# Patient Record
Sex: Male | Born: 1960 | Race: White | Hispanic: No | Marital: Married | Smoking: Former smoker
Health system: Southern US, Community
[De-identification: ages and names within clinical notes are randomized; demographics above are authoritative.]

## PROBLEM LIST (undated history)

## (undated) DIAGNOSIS — K5900 Constipation, unspecified: Secondary | ICD-10-CM

## (undated) DIAGNOSIS — I10 Essential (primary) hypertension: Secondary | ICD-10-CM

## (undated) DIAGNOSIS — K5730 Diverticulosis of large intestine without perforation or abscess without bleeding: Secondary | ICD-10-CM

## (undated) DIAGNOSIS — K5792 Diverticulitis of intestine, part unspecified, without perforation or abscess without bleeding: Secondary | ICD-10-CM

## (undated) DIAGNOSIS — M1990 Unspecified osteoarthritis, unspecified site: Secondary | ICD-10-CM

## (undated) DIAGNOSIS — Z8739 Personal history of other diseases of the musculoskeletal system and connective tissue: Secondary | ICD-10-CM

## (undated) HISTORY — PX: DENTAL SURGERY: SHX609

## (undated) HISTORY — PX: COLONOSCOPY: SHX174

---

## 2012-03-04 ENCOUNTER — Other Ambulatory Visit: Payer: Self-pay | Admitting: Neurology

## 2012-03-04 DIAGNOSIS — I739 Peripheral vascular disease, unspecified: Secondary | ICD-10-CM

## 2012-03-08 ENCOUNTER — Ambulatory Visit
Admission: RE | Admit: 2012-03-08 | Discharge: 2012-03-08 | Disposition: A | Discharge status: Home / Self Care | Payer: BC Managed Care – PPO | Source: Ambulatory Visit | Attending: Neurology | Admitting: Neurology

## 2012-03-08 DIAGNOSIS — I739 Peripheral vascular disease, unspecified: Secondary | ICD-10-CM

## 2013-10-31 IMAGING — US US CAROTID DUPLEX BILAT
1 series · 13 of 24 positions shown · non-contrast
Comparison: None.

CLINICAL DATA: Small vessel ischemic changes on brain MRI.

BILATERAL CAROTID DUPLEX ULTRASOUND
TECHNIQUE: Gray scale imaging, color Doppler and duplex ultrasound
was performed of bilateral carotid and vertebral arteries in the
neck.

[Series 1: us carotid duplex bilat · 0.08mm/px · 13 of 68 slices shown]
[im 1/68]
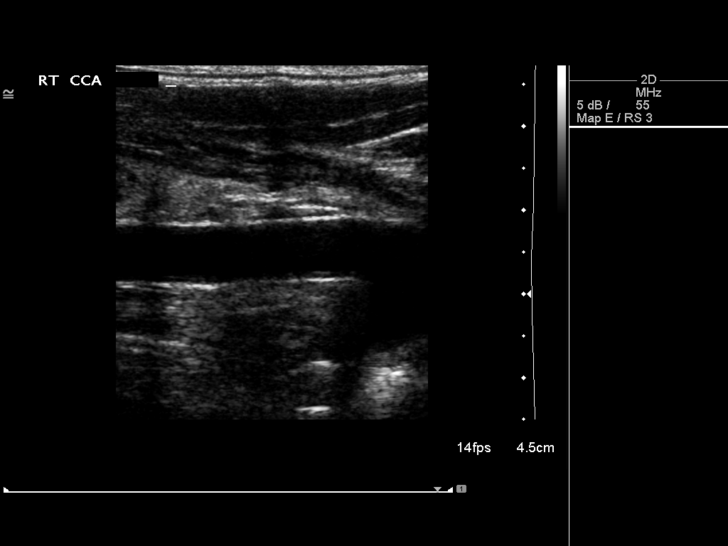
[im 6/68]
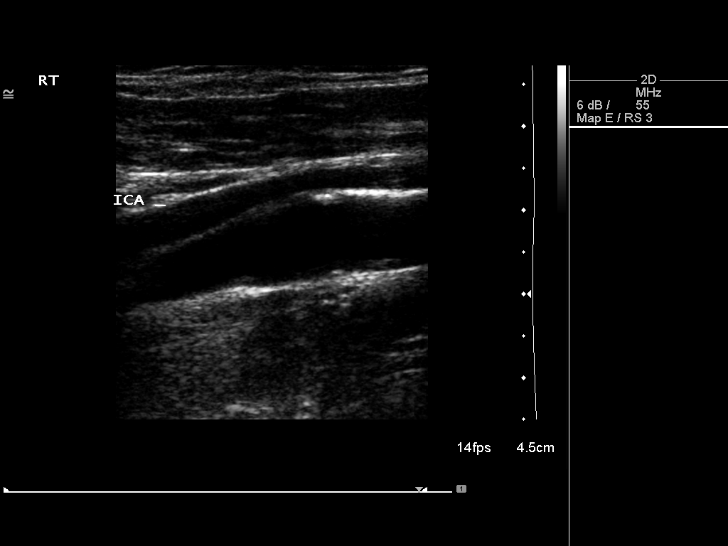
[im 12/68]
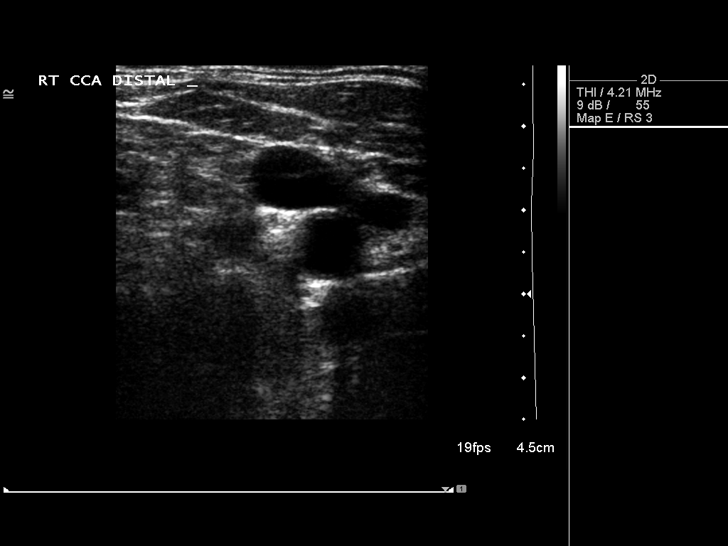
[im 18/68]
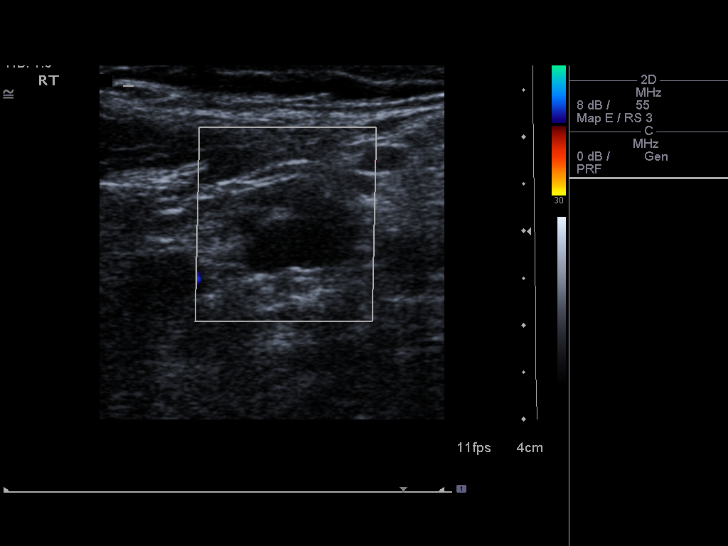
[im 24/68]
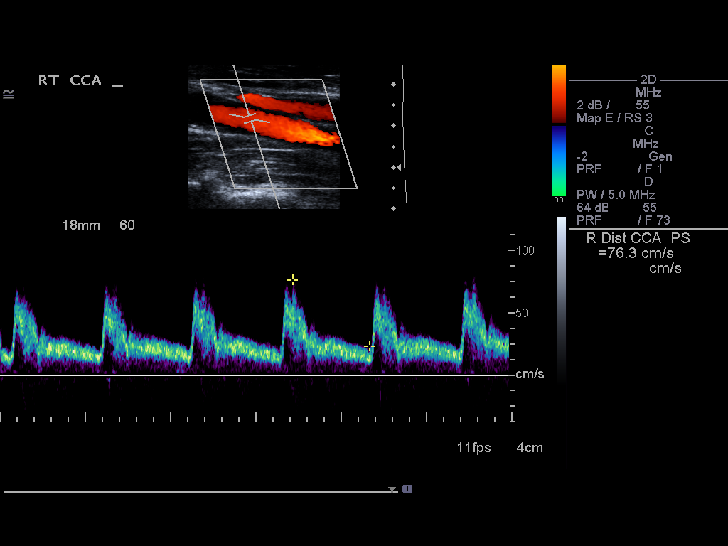
[im 30/68]
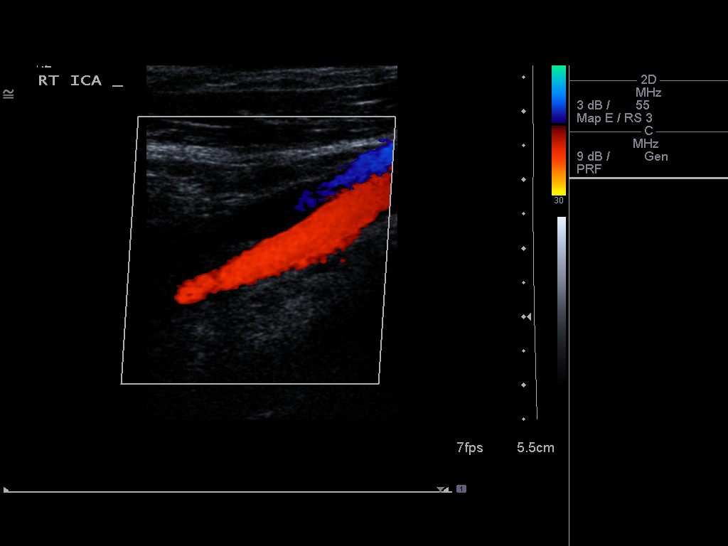
[im 35/68]
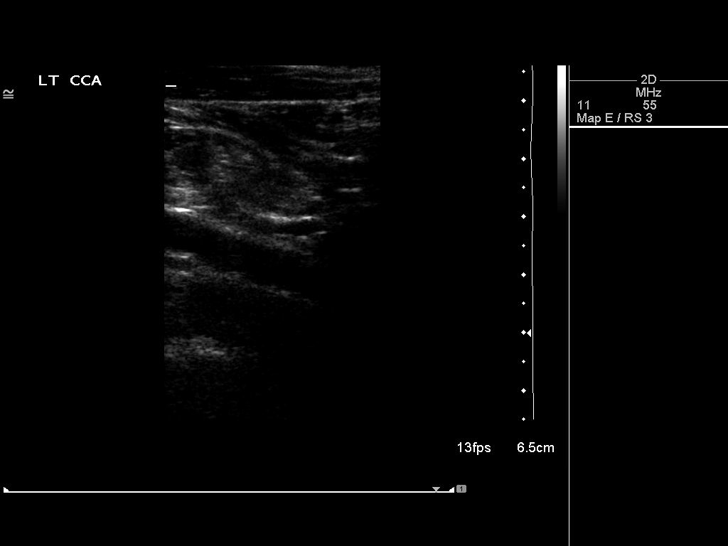
[im 38/68]
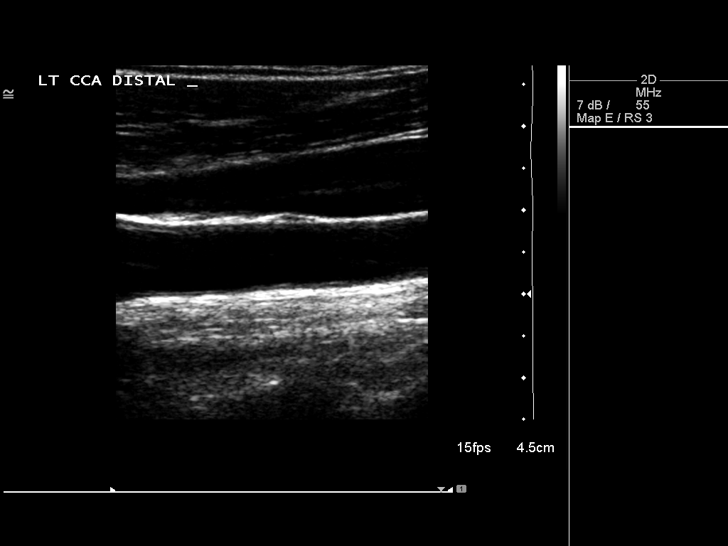
[im 44/68]
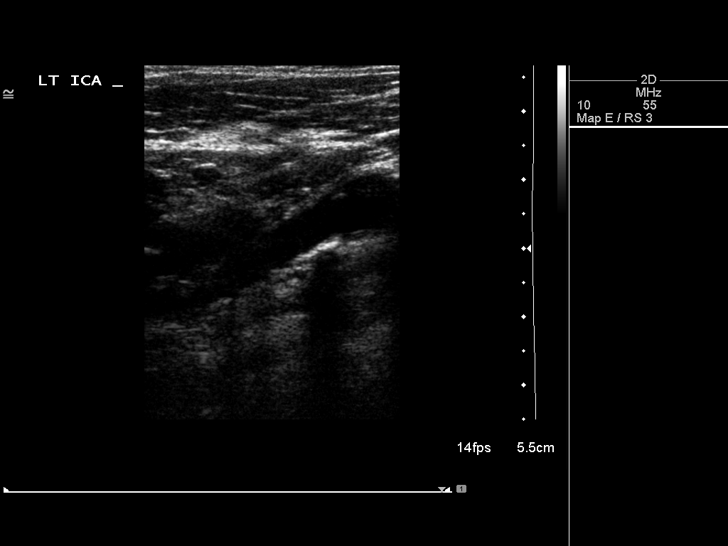
[im 50/68]
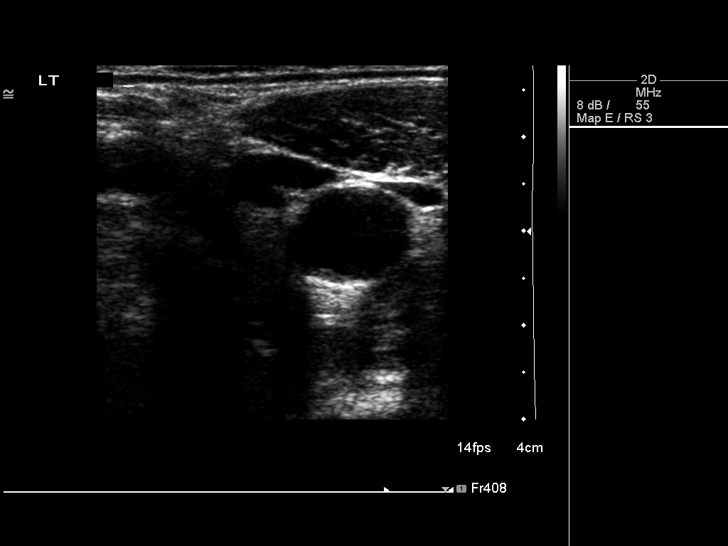
[im 56/68]
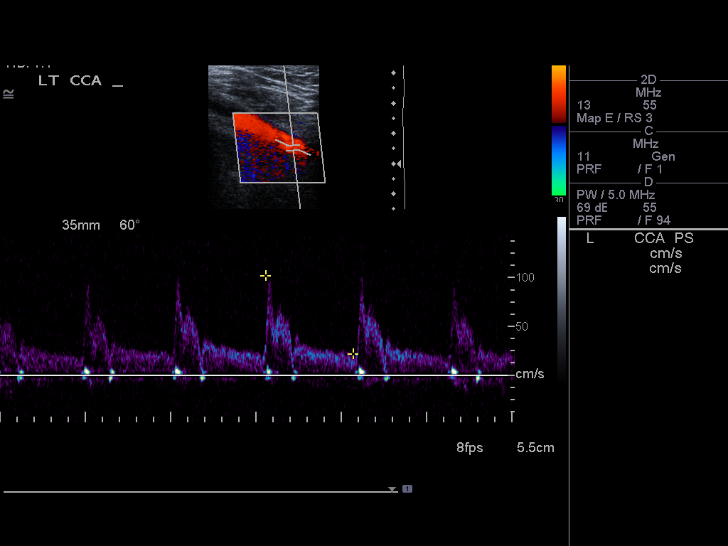
[im 62/68]
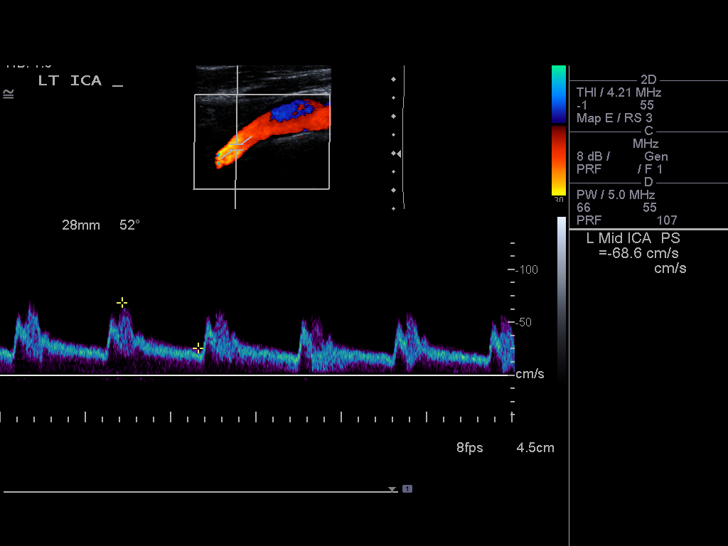
[im 68/68]
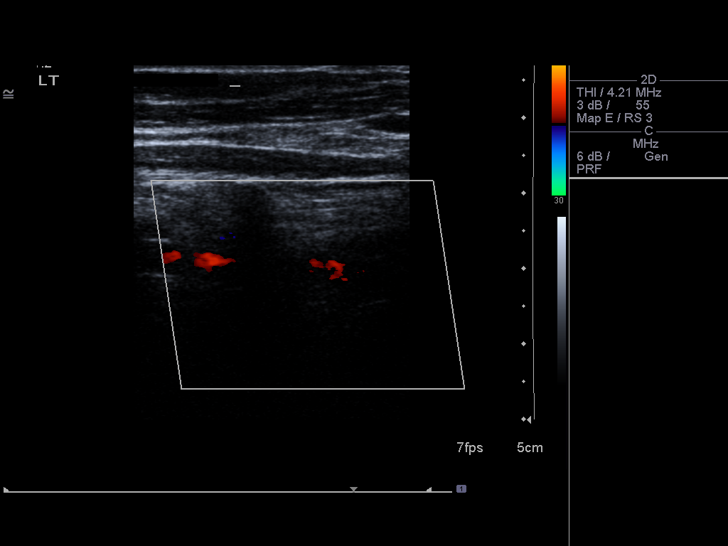

[13 of 24 positions shown; findings below may reference images not displayed]

Criteria:  Quantification of carotid stenosis is based on velocity
parameters that correlate the residual internal carotid diameter
with NASCET-based stenosis levels, using the diameter of the distal
internal carotid lumen as the denominator for stenosis measurement.

The following velocity measurements were obtained:

                 PEAK SYSTOLIC/END DIASTOLIC
RIGHT
ICA:                        74cm/sec
CCA:                        117cm/sec
SYSTOLIC ICA/CCA RATIO:
DIASTOLIC ICA/CCA RATIO:
ECA:                        94cm/sec

LEFT
ICA:                        69cm/sec
CCA:                        105cm/sec
SYSTOLIC ICA/CCA RATIO:
DIASTOLIC ICA/CCA RATIO:
ECA:                        118cm/sec
FINDINGS: RIGHT CAROTID ARTERY: Small amount of atherosclerotic plaque in the
proximal internal carotid artery and carotid bulb. Right
heterogeneous thyroid nodule measuring up to 0.9 cm.  Normal
waveforms and velocities in the right internal carotid artery.

RIGHT VERTEBRAL ARTERY:  Antegrade flow and normal waveform in the
right vertebral artery.]

LEFT CAROTID ARTERY: There is mild plaque in the left internal
carotid artery.  Mild amount of plaque at the left carotid bulb.
Small lymph nodes on both sides of the neck.  Normal waveforms and
velocities in the left internal carotid artery.

LEFT VERTEBRAL ARTERY:  Antegrade flow and normal waveform in the
left vertebral artery.]
IMPRESSION: Mild atherosclerotic disease in the carotid arteries bilaterally.
Estimated degree of stenosis in the internal carotid arteries is
less than 50% bilaterally.

0.9 cm right thyroid nodule.

## 2017-02-10 DIAGNOSIS — Z125 Encounter for screening for malignant neoplasm of prostate: Secondary | ICD-10-CM | POA: Diagnosis not present

## 2017-02-10 DIAGNOSIS — N529 Male erectile dysfunction, unspecified: Secondary | ICD-10-CM | POA: Diagnosis not present

## 2017-02-10 DIAGNOSIS — Z131 Encounter for screening for diabetes mellitus: Secondary | ICD-10-CM | POA: Diagnosis not present

## 2017-02-10 DIAGNOSIS — Z1211 Encounter for screening for malignant neoplasm of colon: Secondary | ICD-10-CM | POA: Diagnosis not present

## 2017-02-10 DIAGNOSIS — Z0000 Encounter for general adult medical examination without abnormal findings: Secondary | ICD-10-CM | POA: Diagnosis not present

## 2017-02-10 DIAGNOSIS — Z13220 Encounter for screening for lipoid disorders: Secondary | ICD-10-CM | POA: Diagnosis not present

## 2017-02-17 LAB — FECAL OCCULT BLOOD, IMMUNOCHEMICAL: IFOBT: NEGATIVE

## 2018-02-23 DIAGNOSIS — I10 Essential (primary) hypertension: Secondary | ICD-10-CM | POA: Diagnosis not present

## 2018-02-23 DIAGNOSIS — E785 Hyperlipidemia, unspecified: Secondary | ICD-10-CM | POA: Diagnosis not present

## 2018-02-23 DIAGNOSIS — N529 Male erectile dysfunction, unspecified: Secondary | ICD-10-CM | POA: Diagnosis not present

## 2018-02-23 DIAGNOSIS — Z0000 Encounter for general adult medical examination without abnormal findings: Secondary | ICD-10-CM | POA: Diagnosis not present

## 2018-02-23 LAB — HEPATIC FUNCTION PANEL
ALT: 20 (ref 10–40)
AST: 16 (ref 14–40)
Alkaline Phosphatase: 76 (ref 25–125)
Bilirubin, Total: 0.7

## 2018-02-23 LAB — PSA: PSA: 0.78

## 2018-02-23 LAB — LIPID PANEL
Cholesterol: 191 (ref 0–200)
HDL: 35 (ref 35–70)
LDL Cholesterol: 130
Triglycerides: 131 (ref 40–160)

## 2018-02-23 LAB — BASIC METABOLIC PANEL
BUN: 18 (ref 4–21)
Creatinine: 1 (ref 0.6–1.3)
Glucose: 101
Potassium: 4.5 (ref 3.4–5.3)
Sodium: 139 (ref 137–147)

## 2019-01-09 DIAGNOSIS — M109 Gout, unspecified: Secondary | ICD-10-CM | POA: Diagnosis not present

## 2019-02-01 DIAGNOSIS — R198 Other specified symptoms and signs involving the digestive system and abdomen: Secondary | ICD-10-CM | POA: Diagnosis not present

## 2019-02-01 DIAGNOSIS — R1032 Left lower quadrant pain: Secondary | ICD-10-CM | POA: Diagnosis not present

## 2019-02-03 DIAGNOSIS — R198 Other specified symptoms and signs involving the digestive system and abdomen: Secondary | ICD-10-CM | POA: Diagnosis not present

## 2019-02-03 DIAGNOSIS — R1032 Left lower quadrant pain: Secondary | ICD-10-CM | POA: Diagnosis not present

## 2019-02-03 DIAGNOSIS — D126 Benign neoplasm of colon, unspecified: Secondary | ICD-10-CM | POA: Diagnosis not present

## 2019-02-03 DIAGNOSIS — K6389 Other specified diseases of intestine: Secondary | ICD-10-CM | POA: Diagnosis not present

## 2019-02-03 DIAGNOSIS — K635 Polyp of colon: Secondary | ICD-10-CM | POA: Diagnosis not present

## 2019-02-03 DIAGNOSIS — K5730 Diverticulosis of large intestine without perforation or abscess without bleeding: Secondary | ICD-10-CM | POA: Diagnosis not present

## 2019-02-03 DIAGNOSIS — K5289 Other specified noninfective gastroenteritis and colitis: Secondary | ICD-10-CM | POA: Diagnosis not present

## 2019-02-03 LAB — HM COLONOSCOPY

## 2019-02-18 ENCOUNTER — Ambulatory Visit: Payer: Self-pay | Admitting: Surgery

## 2019-02-18 DIAGNOSIS — K5790 Diverticulosis of intestine, part unspecified, without perforation or abscess without bleeding: Secondary | ICD-10-CM | POA: Diagnosis not present

## 2019-02-18 DIAGNOSIS — K56699 Other intestinal obstruction unspecified as to partial versus complete obstruction: Secondary | ICD-10-CM | POA: Diagnosis not present

## 2019-02-21 ENCOUNTER — Other Ambulatory Visit: Payer: Self-pay | Admitting: Surgery

## 2019-02-21 DIAGNOSIS — K56699 Other intestinal obstruction unspecified as to partial versus complete obstruction: Secondary | ICD-10-CM

## 2019-02-21 DIAGNOSIS — K5790 Diverticulosis of intestine, part unspecified, without perforation or abscess without bleeding: Secondary | ICD-10-CM

## 2019-03-07 ENCOUNTER — Ambulatory Visit
Admission: RE | Admit: 2019-03-07 | Discharge: 2019-03-07 | Disposition: A | Discharge status: Home / Self Care | Payer: Self-pay | Source: Ambulatory Visit | Attending: Surgery | Admitting: Surgery

## 2019-03-07 DIAGNOSIS — K56699 Other intestinal obstruction unspecified as to partial versus complete obstruction: Secondary | ICD-10-CM

## 2019-03-07 DIAGNOSIS — K5790 Diverticulosis of intestine, part unspecified, without perforation or abscess without bleeding: Secondary | ICD-10-CM

## 2019-03-16 HISTORY — PX: OTHER SURGICAL HISTORY: SHX169

## 2019-03-30 DIAGNOSIS — M659 Synovitis and tenosynovitis, unspecified: Secondary | ICD-10-CM | POA: Diagnosis not present

## 2019-03-30 DIAGNOSIS — M7752 Other enthesopathy of left foot: Secondary | ICD-10-CM | POA: Diagnosis not present

## 2019-03-30 DIAGNOSIS — M19072 Primary osteoarthritis, left ankle and foot: Secondary | ICD-10-CM | POA: Diagnosis not present

## 2019-03-30 DIAGNOSIS — M67372 Transient synovitis, left ankle and foot: Secondary | ICD-10-CM | POA: Diagnosis not present

## 2019-03-30 DIAGNOSIS — M109 Gout, unspecified: Secondary | ICD-10-CM | POA: Diagnosis not present

## 2019-04-01 DIAGNOSIS — M7751 Other enthesopathy of right foot: Secondary | ICD-10-CM | POA: Diagnosis not present

## 2019-04-01 DIAGNOSIS — M659 Synovitis and tenosynovitis, unspecified: Secondary | ICD-10-CM | POA: Diagnosis not present

## 2019-04-03 ENCOUNTER — Encounter: Payer: Self-pay | Admitting: Surgery

## 2019-04-03 DIAGNOSIS — K56699 Other intestinal obstruction unspecified as to partial versus complete obstruction: Secondary | ICD-10-CM | POA: Diagnosis present

## 2019-04-03 NOTE — H&P (Signed)
General Surgery [facility]  Flora Lippsussell A Lutter DOB: 09/08/1961 Married / Language: English / Race: White Male   History of Present Illness  The patient is a 58 year old male who presents with diverticulitis.  CHIEF COMPLAINT: diverticular disease with stricture  Patient is referred by Dr. Vida RiggerMarc Magod for surgical evaluation and management of diverticular disease with stricture. Patient has had a recent history of progressive constipation. He has had at least 3 episodes where he needed to use magnesium citrate to relieve constipation. Patient presented to his gastroenterologist for evaluation and underwent colonoscopy. This demonstrated a significant stricture in the sigmoid colon with associated diverticular disease. Patient is currently taking MiraLAX. He is avoiding fiber. He is referred at this time for consideration for sigmoid colectomy for management of diverticular disease with stricture. Patient has not had any imaging studies. He has had no prior abdominal surgery. There is no family history of diverticular disease or colon cancer.   Past Surgical History Colon Polyp Removal - Colonoscopy  Vasectomy   Diagnostic Studies History Colonoscopy  within last year  Allergies No Known Drug Allergies  [02/18/2019]: Allergies Reconciled   Medication History Losartan Potassium (25MG  Tablet, Oral) Active. Vitamin D (50 MCG(2000 UT) Tablet, Oral) Active. Medications Reconciled  Social History Alcohol use  Occasional alcohol use. Caffeine use  Carbonated beverages, Coffee. Illicit drug use  Remotely quit drug use. Tobacco use  Current every day smoker.  Family History Hypertension  Father.  Other Problems No pertinent past medical history   Review of Systems  General Not Present- Appetite Loss, Chills, Fatigue, Fever, Night Sweats, Weight Gain and Weight Loss. Skin Not Present- Change in Wart/Mole, Dryness, Hives, Jaundice, New  Lesions, Non-Healing Wounds, Rash and Ulcer. HEENT Present- Wears glasses/contact lenses. Not Present- Earache, Hearing Loss, Hoarseness, Nose Bleed, Oral Ulcers, Ringing in the Ears, Seasonal Allergies, Sinus Pain, Sore Throat, Visual Disturbances and Yellow Eyes. Respiratory Not Present- Bloody sputum, Chronic Cough, Difficulty Breathing, Snoring and Wheezing. Breast Not Present- Breast Mass, Breast Pain, Nipple Discharge and Skin Changes. Cardiovascular Not Present- Chest Pain, Difficulty Breathing Lying Down, Leg Cramps, Palpitations, Rapid Heart Rate, Shortness of Breath and Swelling of Extremities. Gastrointestinal Not Present- Abdominal Pain, Bloating, Bloody Stool, Change in Bowel Habits, Chronic diarrhea, Constipation, Difficulty Swallowing, Excessive gas, Gets full quickly at meals, Hemorrhoids, Indigestion, Nausea, Rectal Pain and Vomiting. Male Genitourinary Not Present- Blood in Urine, Change in Urinary Stream, Frequency, Impotence, Nocturia, Painful Urination, Urgency and Urine Leakage. Musculoskeletal Not Present- Back Pain, Joint Pain, Joint Stiffness, Muscle Pain, Muscle Weakness and Swelling of Extremities. Neurological Not Present- Decreased Memory, Fainting, Headaches, Numbness, Seizures, Tingling, Tremor, Trouble walking and Weakness. Psychiatric Not Present- Anxiety, Bipolar, Change in Sleep Pattern, Depression, Fearful and Frequent crying. Endocrine Not Present- Cold Intolerance, Excessive Hunger, Hair Changes, Heat Intolerance, Hot flashes and New Diabetes. Hematology Not Present- Blood Thinners, Easy Bruising, Excessive bleeding, Gland problems, HIV and Persistent Infections.  Vitals Weight: 214 lb Height: 71in Body Surface Area: 2.17 m Body Mass Index: 29.85 kg/m  Temp.: 98.43F  Pulse: 65 (Regular)  BP: 154/84(Sitting, Left Arm, Standard)  Physical Exam   See vital signs recorded above  GENERAL APPEARANCE Development: normal Nutritional status:  normal Gross deformities: none  SKIN Rash, lesions, ulcers: none Induration, erythema: none Nodules: none palpable  EYES Conjunctiva and lids: normal Pupils: equal and reactive Iris: normal bilaterally  EARS, NOSE, MOUTH, THROAT External ears: no lesion or deformity External nose: no lesion or deformity Hearing: grossly  normal Lips: no lesion or deformity Dentition: normal for age Oral mucosa: moist  NECK Symmetric: yes Trachea: midline Thyroid: no palpable nodules in the thyroid bed  CHEST Respiratory effort: normal Retraction or accessory muscle use: no Breath sounds: normal bilaterally Rales, rhonchi, wheeze: none  CARDIOVASCULAR Auscultation: regular rhythm, normal rate Murmurs: none Pulses: carotid and radial pulse 2+ palpable Lower extremity edema: none Lower extremity varicosities: none  ABDOMEN Distension: none Masses: none palpable Tenderness: mild tenderness LLQ, no mass Hepatosplenomegaly: not present Hernia: not present  MUSCULOSKELETAL Station and gait: normal Digits and nails: no clubbing or cyanosis Muscle strength: grossly normal all extremities Range of motion: grossly normal all extremities Deformity: none  LYMPHATIC Cervical: none palpable Supraclavicular: none palpable  PSYCHIATRIC Oriented to person, place, and time: yes Mood and affect: normal for situation Judgment and insight: appropriate for situation    Assessment & Plan  DIVERTICULAR DISEASE (K57.90) 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 Patient is provided with written literature on diverticular disease and colorectal surgery. He will review these at home.  Patient has evidence of a significant diverticular stricture in the sigmoid colon on colonoscopy. I would like to obtain a barium enema to provide a "roadmap" prior to surgery. This  will help [place] determine the location and extent of his resection. We discussed open surgery versus laparoscopic surgery versus robotic surgery. Based on the patient's body habitus and the location of his stricture, I have recommended a low anterior midline open approach to sigmoid colectomy with primary anastomosis. We have discussed the hospitalization to be anticipated. We have discussed his postoperative recovery and return to activity and to work. We have discussed potential complications including bleeding and infection. Patient understands and wishes to proceed with surgery in the near future.  The risks and benefits of the procedure have been discussed at length with the patient. The patient understands the proposed procedure, potential alternative treatments, and the course of recovery to be expected. All of the patient's questions have been answered at this time. The patient wishes to proceed with surgery.  ADDENDUM Barium enema study with findings as expected demonstrated sigmoid diverticular disease and focal stricture.  Plan to proceed with sigmoid colectomy.  Armandina Gemma, [facility] Office: [phone]

## 2019-04-04 ENCOUNTER — Other Ambulatory Visit (HOSPITAL_COMMUNITY)
Admission: RE | Admit: 2019-04-04 | Discharge: 2019-04-04 | Disposition: A | Discharge status: Home / Self Care | Payer: BC Managed Care – PPO | Source: Ambulatory Visit | Attending: Surgery | Admitting: Surgery

## 2019-04-04 ENCOUNTER — Encounter (HOSPITAL_COMMUNITY): Payer: Self-pay

## 2019-04-04 DIAGNOSIS — Z1159 Encounter for screening for other viral diseases: Secondary | ICD-10-CM | POA: Diagnosis not present

## 2019-04-04 NOTE — Patient Instructions (Signed)
DUE TO COVID-19 ONLY ONE VISITOR IS ALLOWED IN THE HOSPITAL AT THIS TIME   COVID SWAB TESTING MUST BE COMPLETED ON: April 04, 2019 (Must self quarantine after testing. Follow instructions on handout.)   Your procedure is scheduled on: Thursday, April 07, 2019   Surgery Time:  12 Noon-2:10PM   [facility]    Report to admitting at 10:00 AM   Call this number if you have problems the morning of surgery [phone]   Miralax:  Mix with 64 oz Gatorade/Powerade. Drink gradually over the next few hours (8 oz glass every 15-30 minutes) until gone the day prior to surgery.    Neomycin: At 2 pm, 3 pm and 10 pm after Miralax bowel prep the day prior to surgery.   Metronidazole:  At 2 pm, 3 pm and 10 pm after Miralax bowel prep the day prior to surgery.   Do not eat food:After Midnight.   May have liquids until 9:00AM day of surgery   CLEAR LIQUID DIET  Foods Allowed                                                                     Foods Excluded  Water, Black Coffee and tea, regular and decaf                             liquids that you cannot  Plain Jell-O in any flavor                                             see through such as: Fruit ices (not with fruit pulp)                                     milk, soups, orange juice  Iced Popsicles                                    All solid food Carbonated beverages, regular and diet                                    Cranberry, grape and apple juices Sports drinks like Gatorade Lightly seasoned clear broth or consume(fat free) Sugar, honey syrup  Sample Menu Breakfast                                Lunch                                     Supper Cranberry juice                    Beef broth  Chicken broth Jell-O                                     Grape juice                           Apple juice Coffee or tea                        Jell-O                                       Popsicle                                                Coffee or tea                        Coffee or tea    Drink 2 Ensure drinks the night before surgery.  Complete one Ensure drink the morning of surgery at 9:00AM prior to scheduled surgery.   Brush your teeth the morning of surgery.   Do NOT smoke after Midnight   Take these medicines the morning of surgery with A SIP OF WATER:   DO NOT TAKE ANY DIABETIC MEDICATIONS DAY OF YOUR SURGERY                               You may not have any metal on your body including hair pins, jewelry, and body piercings             Do not wear make-up, lotions, powders, perfumes/cologne, or deodorant             Do not wear nail polish.  Do not shave  48 hours prior to surgery.              Men may shave face and neck.   Do not bring valuables to the hospital. [place].   Contacts, dentures or bridgework may not be worn into surgery.   Bring small overnight bag day of surgery.    Patients discharged the day of surgery will not be allowed to drive home.   Name and phone number of your driver:   Special Instructions: Bring a copy of your healthcare power of attorney and living will documents         the day of surgery if you haven't scanned them in before.              Please read over the following fact sheets you were given: [facility] - Preparing for Surgery Before surgery, you can play an important role.  Because skin is not sterile, your skin needs to be as free of germs as possible.  You can reduce the number of germs on your skin by washing with CHG (chlorahexidine gluconate) soap before surgery.  CHG is an antiseptic cleaner which kills germs and bonds with the skin to continue killing germs even after washing. Please  DO NOT use if you have an allergy to CHG or antibacterial soaps.  If your skin becomes reddened/irritated stop using the CHG and inform your nurse when you arrive at  Short Stay. Do not shave (including legs and underarms) for at least 48 hours prior to the first CHG shower.  You may shave your face/neck.  Please follow these instructions carefully:  1.  Shower with CHG Soap the night before surgery and the  morning of surgery.  2.  If you choose to wash your hair, wash your hair first as usual with your normal  shampoo.  3.  After you shampoo, rinse your hair and body thoroughly to remove the shampoo.                             4.  Use CHG as you would any other liquid soap.  You can apply chg directly to the skin and wash.  Gently with a scrungie or clean washcloth.  5.  Apply the CHG Soap to your body ONLY FROM THE NECK DOWN.   Do   not use on face/ open                           Wound or open sores. Avoid contact with eyes, ears mouth and   genitals (private parts).                       Wash face,  Genitals (private parts) with your normal soap.             6.  Wash thoroughly, paying special attention to the area where your    surgery  will be performed.  7.  Thoroughly rinse your body with warm water from the neck down.  8.  DO NOT shower/wash with your normal soap after using and rinsing off the CHG Soap.                9.  Pat yourself dry with a clean towel.            10.  Wear clean pajamas.            11.  Place clean sheets on your bed the night of your first shower and do not  sleep with pets. Day of Surgery : Do not apply any lotions/deodorants the morning of surgery.  Please wear clean clothes to the hospital/surgery center.  FAILURE TO FOLLOW THESE INSTRUCTIONS MAY RESULT IN THE CANCELLATION OF YOUR SURGERY  PATIENT SIGNATURE_________________________________  NURSE SIGNATURE__________________________________  ________________________________________________________________________   Rogelia MireIncentive Spirometer  An incentive spirometer is a tool that can help keep your lungs clear and active. This tool measures how well you are filling  your lungs with each breath. Taking long deep breaths may help reverse or decrease the chance of developing breathing (pulmonary) problems (especially infection) following:  A long period of time when you are unable to move or be active. BEFORE THE PROCEDURE   If the spirometer includes an indicator to show your best effort, your nurse or respiratory therapist will set it to a desired goal.  If possible, sit up straight or lean slightly forward. Try not to slouch.  Hold the incentive spirometer in an upright position. INSTRUCTIONS FOR USE  1. Sit on the edge of your bed if possible, or sit up as far as you can in bed or on  a chair. 2. Hold the incentive spirometer in an upright position. 3. Breathe out normally. 4. Place the mouthpiece in your mouth and seal your lips tightly around it. 5. Breathe in slowly and as deeply as possible, raising the piston or the ball toward the top of the column. 6. Hold your breath for 3-5 seconds or for as long as possible. Allow the piston or ball to fall to the bottom of the column. 7. Remove the mouthpiece from your mouth and breathe out normally. 8. Rest for a few seconds and repeat Steps 1 through 7 at least 10 times every 1-2 hours when you are awake. Take your time and take a few normal breaths between deep breaths. 9. The spirometer may include an indicator to show your best effort. Use the indicator as a goal to work toward during each repetition. 10. After each set of 10 deep breaths, practice coughing to be sure your lungs are clear. If you have an incision (the cut made at the time of surgery), support your incision when coughing by placing a pillow or rolled up towels firmly against it. Once you are able to get out of bed, walk around indoors and cough well. You may stop using the incentive spirometer when instructed by your caregiver.  RISKS AND COMPLICATIONS  Take your time so you do not get dizzy or light-headed.  If you are in pain, you may  need to take or ask for pain medication before doing incentive spirometry. It is harder to take a deep breath if you are having pain. AFTER USE  Rest and breathe slowly and easily.  It can be helpful to keep track of a log of your progress. Your caregiver can provide you with a simple table to help with this. If you are using the spirometer at home, follow these instructions: SEEK MEDICAL CARE IF:   You are having difficultly using the spirometer.  You have trouble using the spirometer as often as instructed.  Your pain medication is not giving enough relief while using the spirometer.  You develop fever of 100.5 F (38.1 C) or higher. SEEK IMMEDIATE MEDICAL CARE IF:   You cough up bloody sputum that had not been present before.  You develop fever of 102 F (38.9 C) or greater.  You develop worsening pain at or near the incision site. MAKE SURE YOU:   Understand these instructions.  Will watch your condition.  Will get help right away if you are not doing well or get worse. Document Released: 01/12/2007 Document Revised: 11/24/2011 Document Reviewed: 03/15/2007 [facility] Patient Information 2014 [place], [place].   ________________________________________________________________________

## 2019-04-05 ENCOUNTER — Encounter (HOSPITAL_COMMUNITY)
Admission: RE | Admit: 2019-04-05 | Discharge: 2019-04-05 | Disposition: A | Discharge status: Home / Self Care | Payer: BC Managed Care – PPO | Source: Ambulatory Visit | Attending: Surgery | Admitting: Surgery

## 2019-04-05 ENCOUNTER — Encounter (HOSPITAL_COMMUNITY): Payer: Self-pay

## 2019-04-05 ENCOUNTER — Other Ambulatory Visit: Payer: Self-pay

## 2019-04-05 DIAGNOSIS — Z01818 Encounter for other preprocedural examination: Secondary | ICD-10-CM | POA: Insufficient documentation

## 2019-04-05 HISTORY — DX: Unspecified osteoarthritis, unspecified site: M19.90

## 2019-04-05 HISTORY — DX: Essential (primary) hypertension: I10

## 2019-04-05 HISTORY — DX: Diverticulitis of intestine, part unspecified, without perforation or abscess without bleeding: K57.92

## 2019-04-05 HISTORY — DX: Personal history of other diseases of the musculoskeletal system and connective tissue: Z87.39

## 2019-04-05 HISTORY — DX: Diverticulosis of large intestine without perforation or abscess without bleeding: K57.30

## 2019-04-05 HISTORY — DX: Constipation, unspecified: K59.00

## 2019-04-05 LAB — CBC
HCT: 48.2 % (ref 39.0–52.0)
Hemoglobin: 15.4 g/dL (ref 13.0–17.0)
MCH: 30.3 pg (ref 26.0–34.0)
MCHC: 32 g/dL (ref 30.0–36.0)
MCV: 94.9 fL (ref 80.0–100.0)
Platelets: 285 10*3/uL (ref 150–400)
RBC: 5.08 MIL/uL (ref 4.22–5.81)
RDW: 12.2 % (ref 11.5–15.5)
WBC: 16.1 10*3/uL — ABNORMAL HIGH (ref 4.0–10.5)
nRBC: 0 % (ref 0.0–0.2)

## 2019-04-05 LAB — SARS CORONAVIRUS 2 (TAT 6-24 HRS): SARS Coronavirus 2: NEGATIVE

## 2019-04-05 LAB — BASIC METABOLIC PANEL
Anion gap: 7 (ref 5–15)
BUN: 26 mg/dL — ABNORMAL HIGH (ref 6–20)
CO2: 26 mmol/L (ref 22–32)
Calcium: 9.1 mg/dL (ref 8.9–10.3)
Chloride: 104 mmol/L (ref 98–111)
Creatinine, Ser: 1.02 mg/dL (ref 0.61–1.24)
GFR calc Af Amer: >60 mL/min (ref >60)
GFR calc non Af Amer: >60 mL/min (ref >60)
Glucose, Bld: 92 mg/dL (ref 70–99)
Potassium: 4.8 mmol/L (ref 3.5–5.1)
Sodium: 137 mmol/L (ref 135–145)

## 2019-04-05 LAB — HEMOGLOBIN A1C
Hgb A1c MFr Bld: 6 % — ABNORMAL HIGH (ref 4.8–5.6)
Mean Plasma Glucose: 125.5 mg/dL

## 2019-04-05 MED ORDER — NEOMYCIN SULFATE 500 MG PO TABS
1000.0000 mg | ORAL_TABLET | ORAL | Status: DC
  Filled 2019-04-05: qty 2

## 2019-04-05 MED ORDER — METRONIDAZOLE 500 MG PO TABS
1000.0000 mg | ORAL_TABLET | ORAL | Status: DC
  Filled 2019-04-05: qty 2

## 2019-04-05 MED ORDER — POLYETHYLENE GLYCOL 3350 17 GM/SCOOP PO POWD
1.0000 | Freq: Once | ORAL | Status: DC
  Filled 2019-04-05: qty 255

## 2019-04-05 NOTE — Progress Notes (Signed)
SPOKE W/  Carrel     SCREENING SYMPTOMS OF COVID 19:   COUGH--NO  RUNNY NOSE--- NO  SORE THROAT---NO  NASAL CONGESTION----NO  SNEEZING----NO  SHORTNESS OF BREATH---NO  DIFFICULTY BREATHING---NO  TEMP >100.0 -----NO  UNEXPLAINED BODY ACHES------NO  CHILLS -------- NO  HEADACHES ---------NO  LOSS OF SMELL/ TASTE --------NO    HAVE YOU OR ANY FAMILY MEMBER TRAVELLED PAST 14 DAYS OUT OF THE   COUNTY---NO STATE----NO COUNTRY----NO  HAVE YOU OR ANY FAMILY MEMBER BEEN EXPOSED TO ANYONE WITH COVID 19? NO

## 2019-04-06 ENCOUNTER — Encounter (HOSPITAL_COMMUNITY): Admission: RE | Admit: 2019-04-06 | Payer: Self-pay | Source: Ambulatory Visit

## 2019-04-06 ENCOUNTER — Ambulatory Visit: Payer: Self-pay | Admitting: Physician Assistant

## 2019-04-06 MED ORDER — SODIUM CHLORIDE 0.9 % IV SOLN
INTRAVENOUS | Status: DC
  Filled 2019-04-06: qty 6

## 2019-04-07 ENCOUNTER — Other Ambulatory Visit: Payer: Self-pay

## 2019-04-07 ENCOUNTER — Encounter (HOSPITAL_COMMUNITY)
Admission: RE | Disposition: A | Discharge status: Home / Self Care | Payer: Self-pay | Source: Home / Self Care | Attending: Surgery

## 2019-04-07 ENCOUNTER — Encounter (HOSPITAL_COMMUNITY): Payer: Self-pay | Admitting: Certified Registered Nurse Anesthetist

## 2019-04-07 ENCOUNTER — Inpatient Hospital Stay (HOSPITAL_COMMUNITY): Payer: BC Managed Care – PPO | Admitting: Certified Registered Nurse Anesthetist

## 2019-04-07 ENCOUNTER — Inpatient Hospital Stay (HOSPITAL_COMMUNITY): Payer: BC Managed Care – PPO | Admitting: Physician Assistant

## 2019-04-07 ENCOUNTER — Inpatient Hospital Stay (HOSPITAL_COMMUNITY)
Admission: RE | Admit: 2019-04-07 | Discharge: 2019-04-11 | DRG: 330 | Disposition: A | Discharge status: Home / Self Care | Payer: BC Managed Care – PPO | Attending: Surgery | Admitting: Surgery

## 2019-04-07 DIAGNOSIS — F17200 Nicotine dependence, unspecified, uncomplicated: Secondary | ICD-10-CM | POA: Diagnosis not present

## 2019-04-07 DIAGNOSIS — K5732 Diverticulitis of large intestine without perforation or abscess without bleeding: Secondary | ICD-10-CM | POA: Diagnosis not present

## 2019-04-07 DIAGNOSIS — K56699 Other intestinal obstruction unspecified as to partial versus complete obstruction: Secondary | ICD-10-CM | POA: Diagnosis present

## 2019-04-07 DIAGNOSIS — K5730 Diverticulosis of large intestine without perforation or abscess without bleeding: Secondary | ICD-10-CM | POA: Diagnosis not present

## 2019-04-07 DIAGNOSIS — I10 Essential (primary) hypertension: Secondary | ICD-10-CM | POA: Diagnosis present

## 2019-04-07 DIAGNOSIS — Z8719 Personal history of other diseases of the digestive system: Secondary | ICD-10-CM

## 2019-04-07 DIAGNOSIS — K5792 Diverticulitis of intestine, part unspecified, without perforation or abscess without bleeding: Secondary | ICD-10-CM | POA: Diagnosis not present

## 2019-04-07 DIAGNOSIS — Z8249 Family history of ischemic heart disease and other diseases of the circulatory system: Secondary | ICD-10-CM

## 2019-04-07 DIAGNOSIS — M1990 Unspecified osteoarthritis, unspecified site: Secondary | ICD-10-CM | POA: Diagnosis not present

## 2019-04-07 DIAGNOSIS — N321 Vesicointestinal fistula: Secondary | ICD-10-CM | POA: Diagnosis present

## 2019-04-07 HISTORY — PX: PARTIAL COLECTOMY: SHX5273

## 2019-04-07 LAB — CBC
HCT: 47.5 % (ref 39.0–52.0)
Hemoglobin: 15.2 g/dL (ref 13.0–17.0)
MCH: 30.8 pg (ref 26.0–34.0)
MCHC: 32 g/dL (ref 30.0–36.0)
MCV: 96.2 fL (ref 80.0–100.0)
Platelets: 268 10*3/uL (ref 150–400)
RBC: 4.94 MIL/uL (ref 4.22–5.81)
RDW: 12.3 % (ref 11.5–15.5)
WBC: 27.4 10*3/uL — ABNORMAL HIGH (ref 4.0–10.5)
nRBC: 0 % (ref 0.0–0.2)

## 2019-04-07 LAB — CREATININE, SERUM
Creatinine, Ser: 1.1 mg/dL (ref 0.61–1.24)
GFR calc Af Amer: >60 mL/min (ref >60)
GFR calc non Af Amer: >60 mL/min (ref >60)

## 2019-04-07 SURGERY — COLECTOMY, PARTIAL
Anesthesia: General

## 2019-04-07 MED ORDER — HEPARIN SODIUM (PORCINE) 5000 UNIT/ML IJ SOLN
5000.0000 [IU] | Freq: Once | INTRAMUSCULAR | Status: AC
  Administered 2019-04-07: 11:00:00 5000 [IU] via SUBCUTANEOUS
  Filled 2019-04-07: qty 1

## 2019-04-07 MED ORDER — ROCURONIUM BROMIDE 10 MG/ML (PF) SYRINGE
PREFILLED_SYRINGE | INTRAVENOUS | Status: AC
  Filled 2019-04-07: qty 10

## 2019-04-07 MED ORDER — MIDAZOLAM HCL 2 MG/2ML IJ SOLN
INTRAMUSCULAR | Status: AC
  Filled 2019-04-07: qty 2

## 2019-04-07 MED ORDER — ONDANSETRON HCL 4 MG PO TABS: 4.0000 mg | ORAL_TABLET | Freq: Four times a day (QID) | ORAL | Status: DC | PRN

## 2019-04-07 MED ORDER — ALVIMOPAN 12 MG PO CAPS
12.0000 mg | ORAL_CAPSULE | Freq: Two times a day (BID) | ORAL | Status: DC
  Administered 2019-04-08: 12 mg via ORAL
  Filled 2019-04-07 (×2): qty 1

## 2019-04-07 MED ORDER — KETAMINE HCL 10 MG/ML IJ SOLN
INTRAMUSCULAR | Status: DC | PRN
  Administered 2019-04-07: 40 mg via INTRAVENOUS

## 2019-04-07 MED ORDER — NALOXONE HCL 0.4 MG/ML IJ SOLN: 0.4000 mg | INTRAMUSCULAR | Status: DC | PRN

## 2019-04-07 MED ORDER — PROPOFOL 10 MG/ML IV BOLUS
INTRAVENOUS | Status: DC | PRN
  Administered 2019-04-07: 40 mg via INTRAVENOUS
  Administered 2019-04-07: 160 mg via INTRAVENOUS

## 2019-04-07 MED ORDER — DIPHENHYDRAMINE HCL 12.5 MG/5ML PO ELIX: 12.5000 mg | ORAL_SOLUTION | Freq: Four times a day (QID) | ORAL | Status: DC | PRN

## 2019-04-07 MED ORDER — LIDOCAINE 2% (20 MG/ML) 5 ML SYRINGE
INTRAMUSCULAR | Status: AC
  Filled 2019-04-07: qty 5

## 2019-04-07 MED ORDER — LABETALOL HCL 5 MG/ML IV SOLN
INTRAVENOUS | Status: AC
  Filled 2019-04-07: qty 4

## 2019-04-07 MED ORDER — PROPOFOL 10 MG/ML IV BOLUS
INTRAVENOUS | Status: AC
  Filled 2019-04-07: qty 20

## 2019-04-07 MED ORDER — HYDROMORPHONE HCL 1 MG/ML IJ SOLN
0.2500 mg | INTRAMUSCULAR | Status: DC | PRN
  Administered 2019-04-07 (×4): 0.5 mg via INTRAVENOUS

## 2019-04-07 MED ORDER — KETAMINE HCL 10 MG/ML IJ SOLN
INTRAMUSCULAR | Status: AC
  Filled 2019-04-07: qty 1

## 2019-04-07 MED ORDER — SUGAMMADEX SODIUM 200 MG/2ML IV SOLN
INTRAVENOUS | Status: DC | PRN
  Administered 2019-04-07: 200 mg via INTRAVENOUS

## 2019-04-07 MED ORDER — CELECOXIB 200 MG PO CAPS
200.0000 mg | ORAL_CAPSULE | Freq: Two times a day (BID) | ORAL | Status: DC
  Administered 2019-04-07 – 2019-04-10 (×7): 200 mg via ORAL
  Filled 2019-04-07 (×7): qty 1

## 2019-04-07 MED ORDER — MIDAZOLAM HCL 2 MG/2ML IJ SOLN
INTRAMUSCULAR | Status: DC | PRN
  Administered 2019-04-07: 2 mg via INTRAVENOUS

## 2019-04-07 MED ORDER — MORPHINE SULFATE 2 MG/ML IV SOLN
INTRAVENOUS | Status: DC
  Administered 2019-04-07: 17:00:00 2 mg via INTRAVENOUS
  Administered 2019-04-07: 16.5 mg via INTRAVENOUS
  Administered 2019-04-08: 19.5 mg via INTRAVENOUS
  Administered 2019-04-08: 15 mg via INTRAVENOUS
  Administered 2019-04-08: 9 mg via INTRAVENOUS
  Administered 2019-04-08 (×2): via INTRAVENOUS
  Administered 2019-04-09: 8 mg via INTRAVENOUS
  Administered 2019-04-09: 3 mg via INTRAVENOUS
  Administered 2019-04-09: 0 mg via INTRAVENOUS
  Administered 2019-04-09: 21 mg via INTRAVENOUS
  Filled 2019-04-07 (×2): qty 30
  Filled 2019-04-07: qty 25

## 2019-04-07 MED ORDER — FENTANYL CITRATE (PF) 250 MCG/5ML IJ SOLN
INTRAMUSCULAR | Status: AC
  Filled 2019-04-07: qty 5

## 2019-04-07 MED ORDER — SODIUM CHLORIDE 0.9 % IV SOLN
2.0000 g | INTRAVENOUS | Status: AC
  Administered 2019-04-07: 2 g via INTRAVENOUS
  Filled 2019-04-07: qty 2

## 2019-04-07 MED ORDER — DIPHENHYDRAMINE HCL 50 MG/ML IJ SOLN: 12.5000 mg | Freq: Four times a day (QID) | INTRAMUSCULAR | Status: DC | PRN

## 2019-04-07 MED ORDER — ONDANSETRON HCL 4 MG/2ML IJ SOLN: 4.0000 mg | Freq: Four times a day (QID) | INTRAMUSCULAR | Status: DC | PRN

## 2019-04-07 MED ORDER — FENTANYL CITRATE (PF) 100 MCG/2ML IJ SOLN
INTRAMUSCULAR | Status: AC
  Filled 2019-04-07: qty 2

## 2019-04-07 MED ORDER — LABETALOL HCL 5 MG/ML IV SOLN
5.0000 mg | INTRAVENOUS | Status: AC | PRN
  Administered 2019-04-07 (×2): 5 mg via INTRAVENOUS

## 2019-04-07 MED ORDER — LIDOCAINE HCL (CARDIAC) PF 100 MG/5ML IV SOSY
PREFILLED_SYRINGE | INTRAVENOUS | Status: DC | PRN
  Administered 2019-04-07: 80 mg via INTRAVENOUS

## 2019-04-07 MED ORDER — ENSURE SURGERY PO LIQD
237.0000 mL | Freq: Two times a day (BID) | ORAL | Status: DC
  Administered 2019-04-08 – 2019-04-10 (×6): 237 mL via ORAL
  Filled 2019-04-07 (×8): qty 237

## 2019-04-07 MED ORDER — SODIUM CHLORIDE 0.9% FLUSH: 9.0000 mL | INTRAVENOUS | Status: DC | PRN

## 2019-04-07 MED ORDER — ALVIMOPAN 12 MG PO CAPS
12.0000 mg | ORAL_CAPSULE | ORAL | Status: AC
  Administered 2019-04-07: 12 mg via ORAL
  Filled 2019-04-07: qty 1

## 2019-04-07 MED ORDER — CHLORHEXIDINE GLUCONATE CLOTH 2 % EX PADS: 6.0000 | MEDICATED_PAD | Freq: Once | CUTANEOUS | Status: DC

## 2019-04-07 MED ORDER — HYDROMORPHONE HCL 1 MG/ML IJ SOLN
INTRAMUSCULAR | Status: AC
  Filled 2019-04-07: qty 1

## 2019-04-07 MED ORDER — ENOXAPARIN SODIUM 40 MG/0.4ML ~~LOC~~ SOLN
40.0000 mg | SUBCUTANEOUS | Status: DC
  Administered 2019-04-08 – 2019-04-10 (×3): 40 mg via SUBCUTANEOUS
  Filled 2019-04-07 (×3): qty 0.4

## 2019-04-07 MED ORDER — KCL IN DEXTROSE-NACL 20-5-0.45 MEQ/L-%-% IV SOLN
INTRAVENOUS | Status: DC
  Administered 2019-04-07: 18:00:00 via INTRAVENOUS
  Filled 2019-04-07 (×2): qty 1000

## 2019-04-07 MED ORDER — ONDANSETRON HCL 4 MG/2ML IJ SOLN
INTRAMUSCULAR | Status: DC | PRN
  Administered 2019-04-07: 4 mg via INTRAVENOUS

## 2019-04-07 MED ORDER — ACETAMINOPHEN 325 MG PO TABS: 650.0000 mg | ORAL_TABLET | Freq: Four times a day (QID) | ORAL | Status: DC | PRN

## 2019-04-07 MED ORDER — LACTATED RINGERS IV SOLN
INTRAVENOUS | Status: DC
  Administered 2019-04-07 (×2): via INTRAVENOUS
  Administered 2019-04-07: 17:00:00 1000 mL via INTRAVENOUS
  Administered 2019-04-07: 13:00:00 via INTRAVENOUS

## 2019-04-07 MED ORDER — LIDOCAINE 2% (20 MG/ML) 5 ML SYRINGE
INTRAMUSCULAR | Status: DC | PRN
  Administered 2019-04-07: 1.5 mg/kg/h via INTRAVENOUS
  Administered 2019-04-07: 14:00:00 via INTRAVENOUS

## 2019-04-07 MED ORDER — SUCCINYLCHOLINE CHLORIDE 200 MG/10ML IV SOSY
PREFILLED_SYRINGE | INTRAVENOUS | Status: AC
  Filled 2019-04-07: qty 10

## 2019-04-07 MED ORDER — GABAPENTIN 300 MG PO CAPS
300.0000 mg | ORAL_CAPSULE | Freq: Two times a day (BID) | ORAL | Status: DC
  Administered 2019-04-07 – 2019-04-10 (×7): 300 mg via ORAL
  Filled 2019-04-07 (×7): qty 1

## 2019-04-07 MED ORDER — FENTANYL CITRATE (PF) 250 MCG/5ML IJ SOLN
INTRAMUSCULAR | Status: DC | PRN
  Administered 2019-04-07: 50 ug via INTRAVENOUS
  Administered 2019-04-07: 100 ug via INTRAVENOUS
  Administered 2019-04-07: 50 ug via INTRAVENOUS
  Administered 2019-04-07: 100 ug via INTRAVENOUS
  Administered 2019-04-07 (×2): 50 ug via INTRAVENOUS
  Administered 2019-04-07 (×2): 100 ug via INTRAVENOUS

## 2019-04-07 MED ORDER — DEXAMETHASONE SODIUM PHOSPHATE 10 MG/ML IJ SOLN
INTRAMUSCULAR | Status: DC | PRN
  Administered 2019-04-07: 5 mg via INTRAVENOUS

## 2019-04-07 MED ORDER — ROCURONIUM BROMIDE 10 MG/ML (PF) SYRINGE
PREFILLED_SYRINGE | INTRAVENOUS | Status: DC | PRN
  Administered 2019-04-07: 70 mg via INTRAVENOUS
  Administered 2019-04-07: 20 mg via INTRAVENOUS

## 2019-04-07 SURGICAL SUPPLY — 44 items
BLADE EXTENDED COATED 6.5IN (ELECTRODE) ×3 IMPLANT
CHLORAPREP W/TINT 26 (MISCELLANEOUS) ×3 IMPLANT
COVER SURGICAL LIGHT HANDLE (MISCELLANEOUS) ×6 IMPLANT
COVER WAND RF STERILE (DRAPES) IMPLANT
DRAPE UTILITY XL STRL (DRAPES) ×3 IMPLANT
DRSG OPSITE POSTOP 4X10 (GAUZE/BANDAGES/DRESSINGS) IMPLANT
DRSG OPSITE POSTOP 4X6 (GAUZE/BANDAGES/DRESSINGS) IMPLANT
DRSG OPSITE POSTOP 4X8 (GAUZE/BANDAGES/DRESSINGS) IMPLANT
DRSG PAD ABDOMINAL 8X10 ST (GAUZE/BANDAGES/DRESSINGS) IMPLANT
ELECT PENCIL ROCKER SW 15FT (MISCELLANEOUS) ×3 IMPLANT
ELECT REM PT RETURN 15FT ADLT (MISCELLANEOUS) ×3 IMPLANT
EVACUATOR SILICONE 100CC (DRAIN) ×3 IMPLANT
GAUZE SPONGE 4X4 12PLY STRL (GAUZE/BANDAGES/DRESSINGS) IMPLANT
GLOVE SURG ORTHO 8.0 STRL STRW (GLOVE) ×18 IMPLANT
GOWN STRL REUS W/TWL XL LVL3 (GOWN DISPOSABLE) ×12 IMPLANT
HANDLE SUCTION POOLE (INSTRUMENTS) IMPLANT
KIT TURNOVER KIT A (KITS) ×3 IMPLANT
LIGASURE IMPACT 36 18CM CVD LR (INSTRUMENTS) ×3 IMPLANT
PACK COLON (CUSTOM PROCEDURE TRAY) ×3 IMPLANT
SPONGE LAP 18X18 RF (DISPOSABLE) ×6 IMPLANT
STAPLER CIRC CVD 29MM 37CM (STAPLE) ×3 IMPLANT
STAPLER CUT CVD 40MM BLUE (STAPLE) ×3 IMPLANT
STAPLER PROXIMATE 75MM BLUE (STAPLE) ×3 IMPLANT
STAPLER VISISTAT 35W (STAPLE) IMPLANT
SUCTION POOLE HANDLE (INSTRUMENTS)
SUT ETHILON 2 0 PS N (SUTURE) ×3 IMPLANT
SUT NOV 1 T60/GS (SUTURE) IMPLANT
SUT NOVA NAB DX-16 0-1 5-0 T12 (SUTURE) ×15 IMPLANT
SUT NOVA T20/GS 25 (SUTURE) IMPLANT
SUT PDS AB 1 TP1 96 (SUTURE) IMPLANT
SUT PROLENE 0 SH 30 (SUTURE) ×3 IMPLANT
SUT SILK 2 0 (SUTURE) ×2
SUT SILK 2 0 SH CR/8 (SUTURE) ×3 IMPLANT
SUT SILK 2 0SH CR/8 30 (SUTURE) IMPLANT
SUT SILK 2-0 18XBRD TIE 12 (SUTURE) ×1 IMPLANT
SUT SILK 2-0 30XBRD TIE 12 (SUTURE) IMPLANT
SUT SILK 3 0 (SUTURE)
SUT SILK 3 0 SH CR/8 (SUTURE) ×3 IMPLANT
SUT SILK 3-0 18XBRD TIE 12 (SUTURE) IMPLANT
TOWEL OR 17X26 10 PK STRL BLUE (TOWEL DISPOSABLE) IMPLANT
TOWEL OR NON WOVEN STRL DISP B (DISPOSABLE) IMPLANT
TRAY FOLEY MTR SLVR 16FR STAT (SET/KITS/TRAYS/PACK) ×3 IMPLANT
TUBING CONNECTING 10 (TUBING) ×4 IMPLANT
TUBING CONNECTING 10' (TUBING) ×2

## 2019-04-07 NOTE — Anesthesia Preprocedure Evaluation (Signed)
Anesthesia Evaluation  Patient identified by MRN, date of birth, ID band Patient awake    Reviewed: Allergy & Precautions, NPO status , Patient's Chart, lab work & pertinent test results  Airway Mallampati: II  TM Distance: >3 FB     Dental   Pulmonary former smoker,    breath sounds clear to auscultation       Cardiovascular hypertension,  Rhythm:Regular Rate:Normal     Neuro/Psych    GI/Hepatic Neg liver ROS, History noted. CG   Endo/Other  negative endocrine ROS  Renal/GU      Musculoskeletal   Abdominal   Peds  Hematology   Anesthesia Other Findings   Reproductive/Obstetrics                             Anesthesia Physical Anesthesia Plan  ASA: III  Anesthesia Plan: General   Post-op Pain Management:    Induction: Intravenous  PONV Risk Score and Plan: 2 and Ondansetron, Dexamethasone and Midazolam  Airway Management Planned: Oral ETT  Additional Equipment:   Intra-op Plan:   Post-operative Plan: Possible Post-op intubation/ventilation  Informed Consent:     Dental advisory given  Plan Discussed with:   Anesthesia Plan Comments:         Anesthesia Quick Evaluation

## 2019-04-07 NOTE — Anesthesia Procedure Notes (Signed)
Procedure Name: Intubation Date/Time: 04/07/2019 12:57 PM Performed by: Raenette Rover, CRNA Pre-anesthesia Checklist: Patient identified, Emergency Drugs available, Suction available and Patient being monitored Patient Re-evaluated:Patient Re-evaluated prior to induction Oxygen Delivery Method: Circle system utilized Preoxygenation: Pre-oxygenation with 100% oxygen Induction Type: IV induction Ventilation: Mask ventilation without difficulty Laryngoscope Size: Glidescope and 3 Grade View: Grade I Tube type: Oral Tube size: 7.5 mm Number of attempts: 4 Airway Equipment and Method: Stylet and Video-laryngoscopy Placement Confirmation: ETT inserted through vocal cords under direct vision,  positive ETCO2,  CO2 detector and breath sounds checked- equal and bilateral Secured at: 22 cm Tube secured with: Tape Difficulty Due To: Difficulty was unanticipated Comments: Stiff neck. Difficult to extend neck for intubation. Attempts with MAC 4, MAC 3, and Miller 2 unsuccessful. Glidescope LoPro3 yielded intubation with Grade 1 view.

## 2019-04-07 NOTE — Op Note (Signed)
Operative Note  Pre-operative Diagnosis:  Diverticular disease with sigmoid stricture  Post-operative Diagnosis:  same  Surgeon:  Armandina Gemma, MD  Assistant:  Nedra Hai, MD   Procedure:  Sigmoid colectomy  Anesthesia:  General  Estimated Blood Loss:  200 cc  Drains: 19Fr Blake drain in pelvis         Specimen: sigmoid colon to pathology  Indications:  Patient is referred by Dr. Clarene Essex for surgical evaluation and management of diverticular disease with stricture. Patient has had a recent history of progressive constipation. He has had at least 3 episodes where he needed to use magnesium citrate to relieve constipation. Patient presented to his gastroenterologist for evaluation and underwent colonoscopy. This demonstrated a significant stricture in the sigmoid colon with associated diverticular disease. Patient is currently taking MiraLAX. He is avoiding fiber. He is referred at this time for consideration for sigmoid colectomy for management of diverticular disease with stricture.   Procedure Details:  The patient was seen in the pre-op holding area. The risks, benefits, complications, treatment options, and expected outcomes were previously discussed with the patient. The patient agreed with the proposed plan and has signed the informed consent form.  The patient was brought to the operating room by the surgical team, identified as David Maxwell and the procedure verified. A "time out" was completed and the above information confirmed.  Following administration of general endotracheal anesthesia, the patient is positioned in lithotomy and then prepped and draped in the usual aseptic fashion.  After ascertaining that an adequate level of anesthesia been achieved, a lower midline abdominal incision is made with a #10 blade.  Dissection is carried through subcutaneous tissues.  Fascia is incised in the midline and the peritoneal cavity is entered cautiously.  Abdomen is  explored.  There is significant focal diverticular disease in the mid to distal sigmoid colon.  Colon is relatively firmly affixed to the left posterior wall of the bladder.  Balfour retractors placed for exposure.  Beginning at the distal descending colon the peritoneum was incised at the white line and carried distally along the sigmoid mesentery.  The incision was also extended cephalad along the lateral aspect of the descending colon in order to provide for adequate mobilization.  Palpation in the pelvis reveals a firm dense inflammatory mass in the mid to distal portion of the sigmoid colon which is densely adherent to the left posterior aspect of the bladder.  Distal to this there is soft bowel prior to entering the rectum.  A point in the distal descending colon just above visible diverticuli is selected and transected with a GIA stapler.  Mesentery of the sigmoid colon is then taken down using the LigaSure as well as interrupted 2-0 silk figure-of-eight sutures were needed for hemostasis.  Dissection is carried distally past the inflammatory mass.  Mass is then mobilized off of the bladder and pelvic sidewall using the electrocautery for hemostasis.  With some difficulty the mass is separated from the posterior aspect of the bladder leaving a small amount of colon mucosa on the posterior wall of the bladder.  This is cauterized.  There does not appear to be any obvious fistula into the bladder and there does not appear to be any leakage of urine into the pelvis.  Remainder of the mesentery is divided with the LigaSure down to the proximal rectum.  Using a contour stapler the proximal rectum is transected and the specimen is removed from the abdomen.  A sutures used to mark  the distal margin of the sigmoid resection.  It is submitted to pathology for review.  Next the descending colon is further mobilized in order to allow the distal anterior reach to the rectum without tension.  The staple line is  excised.  A rim of tissue was excised to include a couple of diverticuli which were not immediately evident.  A 0 Prolene pursestring suture was then placed.  Using sizers, a 29 EEA stapler is selected.  Anvil is inserted into the distal end of the descending colon and the pursestring suture is tied securely.  The anus is dilated and the 25 dilator is advanced through the rectum and up to the staple line in the proximal rectum.  The 29 dilator is also passed to the staple line without difficulty.  Stapler was then inserted into the rectum advanced to the staple line.  It is angled so that the trocar emerges through the anterior wall of the rectum just above the staple line.  Trocar is advanced.  Anvil is reassembled on to the trocar and the stapler is closed and fired in the usual fashion and the stapler was removed from the rectum.  Pelvis is filled with saline.  Using a rigid proctoscope the rectum is insufflated with air until the bowel is moderately distended.  There is no evidence of leakage from the newly formed anastomosis.  Air is evacuated and the scope was removed from the rectum.  Pelvis is irrigated copiously with warm saline which is evacuated.  Due to the inflammation on the posterior wall the bladder and the appearance of a developing colovesical fistula, a 2219 [place] Blake drain is left in the pelvis anterior to the anastomosis along the posterior wall of the bladder in order to monitor for possible leakage.  Drain is exited through the left lower quadrant of the abdominal wall and secured to the skin with a 2-0 nylon suture.  Omentum is then used to cover the bowel.  Midline incision is then closed in a single layer with interrupted #1 Novafil sutures.  Subcutaneous tissues irrigated.  Skin is closed with stainless steel staples.  Honeycomb dressing is placed.  Drain is placed to bulb suction.  Patient is awakened from anesthesia and brought to the recovery room.  The patient tolerated the  procedure well.   [name], MD [facility] Office: [phone]

## 2019-04-07 NOTE — Interval H&P Note (Signed)
History and Physical Interval Note:  04/07/2019 12:04 PM  David Maxwell  has presented today for surgery, with the diagnosis of diverticular disease with sigmoid stricture.  The various methods of treatment have been discussed with the patient and family. After consideration of risks, benefits and other options for treatment, the patient has consented to    Procedure(s): OPEN SIGMOID COLECTOMY (N/A) as a surgical intervention.    The patient's history has been reviewed, patient examined, no change in status, stable for surgery.  I have reviewed the patient's chart and labs.  Questions were answered to the patient's satisfaction.    Armandina Gemma, [facility] Office: [place]

## 2019-04-07 NOTE — Transfer of Care (Signed)
Immediate Anesthesia Transfer of Care Note  Patient: David Maxwell  Procedure(s) Performed: OPEN SIGMOID COLECTOMY (N/A )  Patient Location: PACU  Anesthesia Type:General  Level of Consciousness: awake, alert  and patient cooperative  Airway & Oxygen Therapy: Patient Spontanous Breathing and Patient connected to face mask oxygen  Post-op Assessment: Report given to RN and Post -op Vital signs reviewed and stable  Post vital signs: Reviewed and stable  Last Vitals:  Vitals Value Taken Time  BP 203/96 04/07/19 1518  Temp    Pulse 78 04/07/19 1525  Resp 12 04/07/19 1525  SpO2 100 % 04/07/19 1525  Vitals shown include unvalidated device data.  Last Pain:  Vitals:   04/07/19 1032  TempSrc:   PainSc: 0-No pain         Complications: No apparent anesthesia complications

## 2019-04-07 NOTE — Anesthesia Postprocedure Evaluation (Signed)
Anesthesia Post Note  Patient: David Maxwell  Procedure(s) Performed: OPEN SIGMOID COLECTOMY (N/A )     Patient location during evaluation: PACU Anesthesia Type: General Level of consciousness: awake and alert Pain management: pain level controlled Vital Signs Assessment: post-procedure vital signs reviewed and stable Respiratory status: spontaneous breathing, nonlabored ventilation, respiratory function stable and patient connected to nasal cannula oxygen Cardiovascular status: blood pressure returned to baseline and stable Postop Assessment: no apparent nausea or vomiting Anesthetic complications: no    Last Vitals:  Vitals:   04/07/19 1645 04/07/19 1648  BP: (!) 155/84   Pulse: 72   Resp: (!) 8 10  Temp: 36.8 C   SpO2: 97% 94%    Last Pain:  Vitals:   04/07/19 1648  TempSrc:   PainSc: [place]

## 2019-04-08 ENCOUNTER — Encounter (HOSPITAL_COMMUNITY): Payer: Self-pay | Admitting: Surgery

## 2019-04-08 LAB — BASIC METABOLIC PANEL
Anion gap: 6 (ref 5–15)
BUN: 17 mg/dL (ref 6–20)
CO2: 25 mmol/L (ref 22–32)
Calcium: 7.7 mg/dL — ABNORMAL LOW (ref 8.9–10.3)
Chloride: 102 mmol/L (ref 98–111)
Creatinine, Ser: 0.94 mg/dL (ref 0.61–1.24)
GFR calc Af Amer: >60 mL/min (ref >60)
GFR calc non Af Amer: >60 mL/min (ref >60)
Glucose, Bld: 153 mg/dL — ABNORMAL HIGH (ref 70–99)
Potassium: 4.5 mmol/L (ref 3.5–5.1)
Sodium: 133 mmol/L — ABNORMAL LOW (ref 135–145)

## 2019-04-08 LAB — CBC
HCT: 42.2 % (ref 39.0–52.0)
Hemoglobin: 13.7 g/dL (ref 13.0–17.0)
MCH: 30.9 pg (ref 26.0–34.0)
MCHC: 32.5 g/dL (ref 30.0–36.0)
MCV: 95.3 fL (ref 80.0–100.0)
Platelets: 236 10*3/uL (ref 150–400)
RBC: 4.43 MIL/uL (ref 4.22–5.81)
RDW: 12.3 % (ref 11.5–15.5)
WBC: 22.4 10*3/uL — ABNORMAL HIGH (ref 4.0–10.5)
nRBC: 0 % (ref 0.0–0.2)

## 2019-04-08 NOTE — Progress Notes (Signed)
     Assessment & Plan: POD#1 - status post sigmoid colectomy  On ERAS protocol - full liquid diet this AM  Pain controlled with PCA  OOB, ambulate  Leave Foley until tomorrow - monitor drain output        Armandina Gemma, MD       [facility]       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  Tolerating full liquids this AM.  Objective: Vital signs in last 24 hours: Temp:  [98 F (36.7 C)-98.7 F (37.1 C)] 98.2 F (36.8 C) (07/24 0438) Pulse Rate:  [56-75] 67 (07/24 0438) Resp:  [8-20] 16 (07/24 0438) BP: (124-226)/(73-188) 151/83 (07/24 0438) SpO2:  [94 %-100 %] 96 % (07/24 0438) Weight:  [98.9 kg] 98.9 kg (07/23 0956) Last BM Date: 04/07/19  Intake/Output from previous day: 07/23 0701 - 07/24 0700 In: 4023.4 [P.O.:240; I.V.:3783.4] Out: 1965 [Urine:1675; Drains:190; Blood:100] Intake/Output this shift: No intake/output data recorded.  Physical Exam: HEENT - sclerae clear, mucous membranes moist Neck - soft Chest - clear bilaterally Cor - RRR Abdomen - soft, dressing dry and intact; quiet; JP with small serosanguinous Ext - no edema, non-tender Neuro - alert & oriented, no focal deficits  Lab Results:  Recent Labs    04/07/19 1718 04/08/19 0337  WBC 27.4* 22.4*  HGB 15.2 13.7  HCT 47.5 42.2  PLT 268 236   BMET Recent Labs    04/05/19 0840 04/07/19 1718 04/08/19 0337  NA 137  --  133*  K 4.8  --  4.5  CL 104  --  102  CO2 26  --  25  GLUCOSE 92  --  153*  BUN 26*  --  17  CREATININE 1.02 1.10 0.94  CALCIUM 9.1  --  7.7*   PT/INR No results for input(s): LABPROT, INR in the last 72 hours. Comprehensive Metabolic Panel:    Component Value Date/Time   NA 133 (L) 04/08/2019 0337   NA 137 04/05/2019 0840   K 4.5 04/08/2019 0337   K 4.8 04/05/2019 0840   CL 102 04/08/2019 0337   CL 104 04/05/2019  0840   CO2 25 04/08/2019 0337   CO2 26 04/05/2019 0840   BUN 17 04/08/2019 0337   BUN 26 (H) 04/05/2019 0840   CREATININE 0.94 04/08/2019 0337   CREATININE 1.10 04/07/2019 1718   GLUCOSE 153 (H) 04/08/2019 0337   GLUCOSE 92 04/05/2019 0840   CALCIUM 7.7 (L) 04/08/2019 0337   CALCIUM 9.1 04/05/2019 0840    Studies/Results: No results found.    Armandina Gemma 04/08/2019  Patient ID: Josefa Half, male   DOB: [birth year], 58 y.o.   MRN: [id number]

## 2019-04-09 MED ORDER — OXYCODONE-ACETAMINOPHEN 5-325 MG PO TABS
1.0000 | ORAL_TABLET | ORAL | Status: DC | PRN
  Administered 2019-04-09: 1 via ORAL
  Administered 2019-04-09 – 2019-04-11 (×9): 2 via ORAL
  Filled 2019-04-09 (×10): qty 2

## 2019-04-09 MED ORDER — MORPHINE SULFATE (PF) 2 MG/ML IV SOLN
2.0000 mg | INTRAVENOUS | Status: DC | PRN
  Administered 2019-04-09 (×2): 2 mg via INTRAVENOUS
  Filled 2019-04-09 (×3): qty 1

## 2019-04-09 NOTE — Progress Notes (Addendum)
Pharmacy Brief Note - Alvimopan (Entereg)  The standing order set for alvimopan (Entereg) now includes an automatic order to discontinue the drug after the patient has had a bowel movement. The change was approved by the [place] and the Medical Executive Committee.  This patient has had a bowel movement documented by MD. Therefore, alvimopan has been discontinued. If there are questions, please contact the pharmacy at [phone].  Thank you   Reuel Boom, PharmD, BCPS [phone] 04/09/2019, 10:15 AM

## 2019-04-09 NOTE — Progress Notes (Signed)
2 Days Post-Op   Subjective/Chief Complaint: No nausea Had BM   Objective: Vital signs in last 24 hours: Temp:  [97.9 F (36.6 C)-98.5 F (36.9 C)] 98.2 F (36.8 C) (07/25 0527) Pulse Rate:  [62-67] 64 (07/25 0527) Resp:  [14-20] 18 (07/25 0527) BP: (132-161)/(70-89) 132/70 (07/25 0527) SpO2:  [95 %-100 %] 100 % (07/25 0527) Weight:  [102 kg] 102 kg (07/25 0500) Last BM Date: 04/09/19  Intake/Output from previous day: 07/24 0701 - 07/25 0700 In: 1460 [P.O.:1080; I.V.:380] Out: 2505 [Urine:4425; Drains:50] Intake/Output this shift: No intake/output data recorded.  Exam: Awake and alert Abdomen soft, dressing dry, drain serosang  Lab Results:  Recent Labs    04/07/19 1718 04/08/19 0337  WBC 27.4* 22.4*  HGB 15.2 13.7  HCT 47.5 42.2  PLT 268 236   BMET Recent Labs    04/07/19 1718 04/08/19 0337  NA  --  133*  K  --  4.5  CL  --  102  CO2  --  25  GLUCOSE  --  153*  BUN  --  17  CREATININE 1.10 0.94  CALCIUM  --  7.7*   PT/INR No results for input(s): LABPROT, INR in the last 72 hours. ABG No results for input(s): PHART, HCO3 in the last 72 hours.  Invalid input(s): PCO2, PO2  Studies/Results: No results found.  Anti-infectives: Anti-infectives (From admission, onward)   Start     Dose/Rate Route Frequency Ordered Stop   04/07/19 1015  cefoTEtan (CEFOTAN) 2 g in sodium chloride 0.9 % 100 mL IVPB     2 g 200 mL/hr over 30 Minutes Intravenous On call to O.R. 04/07/19 1010 04/07/19 1330   04/07/19 0600  clindamycin (CLEOCIN) 900 mg, gentamicin (GARAMYCIN) 240 mg in sodium chloride 0.9 % 1,000 mL for intraperitoneal lavage  Status:  Discontinued      Irrigation To Surgery 04/06/19 0902 04/07/19 1705      Assessment/Plan: s/p Procedure(s): OPEN SIGMOID COLECTOMY (N/A)  D/c foley D/c PCA Advance po  LOS: 2 days    Coralie Keens 04/09/2019

## 2019-04-09 NOTE — Plan of Care (Signed)
Patient lying in bed; PCA discontinued and pain controlled well currently. Encouraged to request pain medication when needed; patient verbalizes understanding. Stressed importance of ambulating in hall today; patient verbalizes understanding and agrees to ambulate in hall today. Will continue to monitor.

## 2019-04-10 LAB — CBC
HCT: 36 % — ABNORMAL LOW (ref 39.0–52.0)
Hemoglobin: 11.2 g/dL — ABNORMAL LOW (ref 13.0–17.0)
MCH: 30.5 pg (ref 26.0–34.0)
MCHC: 31.1 g/dL (ref 30.0–36.0)
MCV: 98.1 fL (ref 80.0–100.0)
Platelets: 203 10*3/uL (ref 150–400)
RBC: 3.67 MIL/uL — ABNORMAL LOW (ref 4.22–5.81)
RDW: 12.4 % (ref 11.5–15.5)
WBC: 14.6 10*3/uL — ABNORMAL HIGH (ref 4.0–10.5)
nRBC: 0 % (ref 0.0–0.2)

## 2019-04-10 NOTE — Plan of Care (Signed)
Patient up in room this morning; has already ambulated in hall some. Plans to ambulate multiple times today. Pain and nausea controlled currently. Will continue to monitor.

## 2019-04-10 NOTE — Progress Notes (Signed)
3 Days Post-Op   Subjective/Chief Complaint: Multiple BM's yesterday, most with blood Minimal abdominal pain Tolerating po Urinating ok  Objective: Vital signs in last 24 hours: Temp:  [97.9 F (36.6 C)-98.1 F (36.7 C)] 97.9 F (36.6 C) (07/26 0550) Pulse Rate:  [57-72] 57 (07/26 0550) Resp:  [16-106] 16 (07/26 0550) BP: (139-150)/(76-82) 143/78 (07/26 0550) SpO2:  [96 %-98 %] 97 % (07/26 0550) Weight:  [99 kg] 99 kg (07/26 0234) Last BM Date: 04/09/19  Intake/Output from previous day: 07/25 0701 - 07/26 0700 In: 1352.1 [P.O.:1320; I.V.:32.1] Out: 235 [Urine:200; Drains:35] Intake/Output this shift: No intake/output data recorded.  Exam: Awake and alert Comfortable Abdomen soft, dressing dry, drain serosang  Lab Results:  Recent Labs    04/07/19 1718 04/08/19 0337  WBC 27.4* 22.4*  HGB 15.2 13.7  HCT 47.5 42.2  PLT 268 236   BMET Recent Labs    04/07/19 1718 04/08/19 0337  NA  --  133*  K  --  4.5  CL  --  102  CO2  --  25  GLUCOSE  --  153*  BUN  --  17  CREATININE 1.10 0.94  CALCIUM  --  7.7*   PT/INR No results for input(s): LABPROT, INR in the last 72 hours. ABG No results for input(s): PHART, HCO3 in the last 72 hours.  Invalid input(s): PCO2, PO2  Studies/Results: No results found.  Anti-infectives: Anti-infectives (From admission, onward)   Start     Dose/Rate Route Frequency Ordered Stop   04/07/19 1015  cefoTEtan (CEFOTAN) 2 g in sodium chloride 0.9 % 100 mL IVPB     2 g 200 mL/hr over 30 Minutes Intravenous On call to O.R. 04/07/19 1010 04/07/19 1330   04/07/19 0600  clindamycin (CLEOCIN) 900 mg, gentamicin (GARAMYCIN) 240 mg in sodium chloride 0.9 % 1,000 mL for intraperitoneal lavage  Status:  Discontinued      Irrigation To Surgery 04/06/19 0902 04/07/19 1705      Assessment/Plan: s/p Procedure(s): OPEN SIGMOID COLECTOMY (N/A)  Check CBC Leave drain until tomorrow   LOS: 3 days    Coralie Keens 04/10/2019

## 2019-04-11 MED ORDER — OXYCODONE HCL 5 MG PO TABS: 5.0000 mg | ORAL_TABLET | ORAL | 0 refills | Status: DC | PRN

## 2019-04-11 MED ORDER — OXYCODONE-ACETAMINOPHEN 5-325 MG PO TABS: 1.0000 | ORAL_TABLET | ORAL | 0 refills | Status: DC | PRN

## 2019-04-11 NOTE — Discharge Summary (Signed)
Physician Discharge Summary [facility]  Patient ID: David Maxwell MRN: [id number] DOB/AGE: 1961-05-10 58 y.o.  Admit date: 04/07/2019 Discharge date: 04/11/2019  Admission Diagnoses:  Sigmoid diverticular disease with stricture  Discharge Diagnoses:  Principal Problem:   Diverticular stricture [facility])   Discharged Condition: good  Hospital Course: Patient was admitted for observation following colon surgery.  Post op course was uncomplicated.  Pain was well controlled.  Tolerated diet as advanced.  Drain monitored for signs of leak from colon or bladder - remained serosanguinous throughout.  Drain removed prior to discharge.  Patient was prepared for discharge home on POD#4.  Consults: None  Treatments: surgery: sigmoid colectomy  Discharge Exam: Blood pressure (!) 149/86, pulse (!) 56, temperature 97.6 F (36.4 C), temperature source Oral, resp. rate 18, height 5\' 11"  (1.803 m), weight 98.6 kg, SpO2 98 %. HEENT - clear Neck - soft Chest - clear bilaterally Cor - RRR Abd - soft, active BS present; midline wound dry and intact with staples; drain with serosanguinous - removed  Disposition: Home  Discharge Instructions    Diet - low sodium heart healthy   Complete by: As directed    Discharge instructions   Complete by: As directed    [facility]  OPEN ABDOMINAL SURGERY: POST OP INSTRUCTIONS  Always review your discharge instruction sheet given to you by the facility where your surgery was performed.  A prescription for pain medication may be given to you upon discharge.  Take your pain medication as prescribed.  If narcotic pain medicine is not needed, then you may take acetaminophen (Tylenol) or ibuprofen (Advil) as needed. Take your usually prescribed medications unless otherwise directed. If you need a refill on your pain medication, please contact your pharmacy. They will contact our office to request authorization.   Prescriptions will not be filled after 5 pm or on weekends. You should follow a light diet the first few days after arrival home, such as soup and crackers, unless your doctor has advised otherwise. A high-fiber, low fat diet can be resumed as tolerated.  Be sure to include plenty of fluids daily.  Most patients will experience some swelling and bruising in the area of the incision. Ice packs will help. Swelling and bruising can take several days to resolve. It is common to experience some constipation if taking pain medication after surgery.  Increasing fluid intake and taking a stool softener will usually help or prevent this problem from occurring.  A mild laxative (Milk of Magnesia or Miralax) should be taken according to package directions if there are no bowel movements after 48 hours.  You may have steri-strips (small skin tapes) in place directly over the incision.  These strips should be left on the skin for 5-7 days.  Any sutures or staples will be removed at the office during your follow-up visit. You may find that a light gauze bandage over your incision may keep your staples from being rubbed or pulled. You may shower and replace the bandage daily. ACTIVITIES:  You may resume regular (light) daily activities beginning the next day - such as daily self-care, walking, climbing stairs - gradually increasing activities as tolerated.  You may have sexual intercourse when it is comfortable.  Refrain from any heavy lifting or straining until approved by your doctor.  You may drive when you no longer are taking prescription pain medication, you can comfortably wear a seatbelt, and you can safely maneuver your car and apply  brakes. You should see your doctor in the office for a follow-up appointment approximately 2-3 weeks after your surgery.  Make sure that you call for this appointment within a day or two after you arrive home to insure a convenient appointment time.  WHEN TO CALL YOUR DOCTOR: Fever  greater than 101.0 Inability to urinate Persistent nausea and/or vomiting Extreme swelling or bruising Continued bleeding from incision Increased pain, redness, or drainage from the incision Difficulty swallowing or breathing Muscle cramping or spasms Numbness or tingling in hands or around lips  IF YOU HAVE DISABILITY OR FAMILY LEAVE FORMS, YOU MUST BRING THEM TO THE OFFICE FOR PROCESSING.  PLEASE DO NOT GIVE THEM TO YOUR DOCTOR.  The clinic staff is available to answer your questions during regular business hours.  Please don't hesitate to call and ask to speak to one of the nurses if you have concerns.  [facility] Office: [phone]  For further questions, please visit [email or web address]   Discharge wound care:   Complete by: As directed    Cover drain site with dry gauze dressing until no further drainage. May shower. Midline wound with staples may be left uncovered if no drainage.  Wash gently with soap and water.  No lifting > 10 lbs for 3 weeks until seen in office for follow-up.   Increase activity slowly   Complete by: As directed      Allergies as of 04/11/2019   No Known Allergies     Medication List    TAKE these medications   losartan 25 MG tablet Commonly known as: COZAAR Take 25 mg by mouth daily.   multivitamin with minerals Tabs tablet Take 1 tablet by mouth daily.   oxyCODONE 5 MG immediate release tablet Commonly known as: Oxy IR/ROXICODONE Take 1-2 tablets (5-10 mg total) by mouth every 4 (four) hours as needed for moderate pain.   oxyCODONE-acetaminophen 5-325 MG tablet Commonly known as: Percocet Take 1 tablet by mouth every 4 (four) hours as needed for moderate pain.   Vitamin D 50 MCG (2000 UT) Caps Take 2,000 Units by mouth daily.   VITAMIN E PO Take 1,200 mg by mouth daily.            Discharge Care Instructions  (From admission, onward)         Start     Ordered   04/11/19 0000  Discharge  wound care:    Comments: Cover drain site with dry gauze dressing until no further drainage. May shower. Midline wound with staples may be left uncovered if no drainage.  Wash gently with soap and water.  No lifting > 10 lbs for 3 weeks until seen in office for follow-up.   04/11/19 [id number]           Velora Hecklerodd M. Jaelin Fackler, MD, [facility] Office: [phone]   Signed: Darnell Levelodd Chauncy Mangiaracina 04/11/2019, 8:35 AM

## 2019-04-11 NOTE — Progress Notes (Signed)
Discharge instructions discussed with patient, verbalized agreement and understanding, 

## 2019-04-27 ENCOUNTER — Ambulatory Visit (INDEPENDENT_AMBULATORY_CARE_PROVIDER_SITE_OTHER): Payer: BC Managed Care – PPO | Admitting: Physician Assistant

## 2019-04-27 ENCOUNTER — Encounter: Payer: Self-pay | Admitting: Physician Assistant

## 2019-04-27 ENCOUNTER — Other Ambulatory Visit: Payer: Self-pay

## 2019-04-27 VITALS — BP 128/82 | HR 63 | Temp 98.8°F | Resp 16 | Ht 71.0 in | Wt 213.0 lb

## 2019-04-27 DIAGNOSIS — Z125 Encounter for screening for malignant neoplasm of prostate: Secondary | ICD-10-CM

## 2019-04-27 DIAGNOSIS — I10 Essential (primary) hypertension: Secondary | ICD-10-CM | POA: Diagnosis not present

## 2019-04-27 DIAGNOSIS — Z0000 Encounter for general adult medical examination without abnormal findings: Secondary | ICD-10-CM | POA: Diagnosis not present

## 2019-04-27 LAB — COMPREHENSIVE METABOLIC PANEL
ALT: 24 U/L (ref 0–53)
AST: 15 U/L (ref 0–37)
Albumin: 4.2 g/dL (ref 3.5–5.2)
Alkaline Phosphatase: 84 U/L (ref 39–117)
BUN: 22 mg/dL (ref 6–23)
CO2: 26 mEq/L (ref 19–32)
Calcium: 9.6 mg/dL (ref 8.4–10.5)
Chloride: 104 mEq/L (ref 96–112)
Creatinine, Ser: 0.9 mg/dL (ref 0.40–1.50)
GFR: 86.74 mL/min (ref >60.00)
Glucose, Bld: 92 mg/dL (ref 70–99)
Potassium: 4.9 mEq/L (ref 3.5–5.1)
Sodium: 139 mEq/L (ref 135–145)
Total Bilirubin: 0.5 mg/dL (ref 0.2–1.2)
Total Protein: 7.1 g/dL (ref 6.0–8.3)

## 2019-04-27 LAB — CBC WITH DIFFERENTIAL/PLATELET
Basophils Absolute: 0.1 10*3/uL (ref 0.0–0.1)
Basophils Relative: 1.3 % (ref 0.0–3.0)
Eosinophils Absolute: 0.2 10*3/uL (ref 0.0–0.7)
Eosinophils Relative: 2.3 % (ref 0.0–5.0)
HCT: 42.2 % (ref 39.0–52.0)
Hemoglobin: 14.1 g/dL (ref 13.0–17.0)
Lymphocytes Relative: 20.3 % (ref 12.0–46.0)
Lymphs Abs: 2.1 10*3/uL (ref 0.7–4.0)
MCHC: 33.4 g/dL (ref 30.0–36.0)
MCV: 95.1 fl (ref 78.0–100.0)
Monocytes Absolute: 1 10*3/uL (ref 0.1–1.0)
Monocytes Relative: 10.1 % (ref 3.0–12.0)
Neutro Abs: 6.7 10*3/uL (ref 1.4–7.7)
Neutrophils Relative %: 66 % (ref 43.0–77.0)
Platelets: 334 10*3/uL (ref 150.0–400.0)
RBC: 4.43 Mil/uL (ref 4.22–5.81)
RDW: 13.5 % (ref 11.5–15.5)
WBC: 10.1 10*3/uL (ref 4.0–10.5)

## 2019-04-27 LAB — LIPID PANEL
Cholesterol: 216 mg/dL — ABNORMAL HIGH (ref 0–200)
HDL: 51.5 mg/dL (ref >39.00)
LDL Cholesterol: 143 mg/dL — ABNORMAL HIGH (ref 0–99)
NonHDL: 164.1
Total CHOL/HDL Ratio: 4
Triglycerides: 108 mg/dL (ref 0.0–149.0)
VLDL: 21.6 mg/dL (ref 0.0–40.0)

## 2019-04-27 LAB — PSA: PSA: 0.91 ng/mL (ref 0.10–4.00)

## 2019-04-27 LAB — TSH: TSH: 1.7 u[IU]/mL (ref 0.35–4.50)

## 2019-04-27 NOTE — Patient Instructions (Signed)
Please go to the lab for blood work.   Our office will call you with your results unless you have chosen to receive results via MyChart.  If your blood work is normal we will follow-up each year for physicals and as scheduled for chronic medical problems.  If anything is abnormal we will treat accordingly and get you in for a follow-up.  Follow-up with your surgeon as scheduled.   If the soreness is not improving, please let them or [place] know so we can assess further.  It was very nice meeting you today. Welcome to [facility]!   Preventive Care 87-60 Years Old, Male Preventive care refers to lifestyle choices and visits with your health care provider that can promote health and wellness. This includes:  A yearly physical exam. This is also called an annual well check.  Regular dental and eye exams.  Immunizations.  Screening for certain conditions.  Healthy lifestyle choices, such as eating a healthy diet, getting regular exercise, not using drugs or products that contain nicotine and tobacco, and limiting alcohol use. What can I expect for my preventive care visit? Physical exam Your health care provider will check:  Height and weight. These may be used to calculate body mass index (BMI), which is a measurement that tells if you are at a healthy weight.  Heart rate and blood pressure.  Your skin for abnormal spots. Counseling Your health care provider may ask you questions about:  Alcohol, tobacco, and drug use.  Emotional well-being.  Home and relationship well-being.  Sexual activity.  Eating habits.  Work and work Statistician. What immunizations do I need?  Influenza (flu) vaccine  This is recommended every year. Tetanus, diphtheria, and pertussis (Tdap) vaccine  You may need a Td booster every 10 years. Varicella (chickenpox) vaccine  You may need this vaccine if you have not already been vaccinated. Zoster (shingles) vaccine  You may need this after  age 27. Measles, mumps, and rubella (MMR) vaccine  You may need at least one dose of MMR if you were born in 1957 or later. You may also need a second dose. Pneumococcal conjugate (PCV13) vaccine  You may need this if you have certain conditions and were not previously vaccinated. Pneumococcal polysaccharide (PPSV23) vaccine  You may need one or two doses if you smoke cigarettes or if you have certain conditions. Meningococcal conjugate (MenACWY) vaccine  You may need this if you have certain conditions. Hepatitis A vaccine  You may need this if you have certain conditions or if you travel or work in places where you may be exposed to hepatitis A. Hepatitis B vaccine  You may need this if you have certain conditions or if you travel or work in places where you may be exposed to hepatitis B. Haemophilus influenzae type b (Hib) vaccine  You may need this if you have certain risk factors. Human papillomavirus (HPV) vaccine  If recommended by your health care provider, you may need three doses over 6 months. You may receive vaccines as individual doses or as more than one vaccine together in one shot (combination vaccines). Talk with your health care provider about the risks and benefits of combination vaccines. What tests do I need? Blood tests  Lipid and cholesterol levels. These may be checked every 5 years, or more frequently if you are over 7 years old.  Hepatitis C test.  Hepatitis B test. Screening  Lung cancer screening. You may have this screening every year starting at age 31  if you have a 30-pack-year history of smoking and currently smoke or have quit within the past 15 years.  Prostate cancer screening. Recommendations will vary depending on your family history and other risks.  Colorectal cancer screening. All adults should have this screening starting at age 65 and continuing until age 32. Your health care provider may recommend screening at age 34 if you are at  increased risk. You will have tests every 1-10 years, depending on your results and the type of screening test.  Diabetes screening. This is done by checking your blood sugar (glucose) after you have not eaten for a while (fasting). You may have this done every 1-3 years.  Sexually transmitted disease (STD) testing. Follow these instructions at home: Eating and drinking  Eat a diet that includes fresh fruits and vegetables, whole grains, lean protein, and low-fat dairy products.  Take vitamin and mineral supplements as recommended by your health care provider.  Do not drink alcohol if your health care provider tells you not to drink.  If you drink alcohol: ? Limit how much you have to 0-2 drinks a day. ? Be aware of how much alcohol is in your drink. In the [place] one drink equals one 12 oz bottle of beer (355 mL), one 5 oz glass of wine (148 mL), or one 1 oz glass of hard liquor (44 mL). Lifestyle  Take daily care of your teeth and gums.  Stay active. Exercise for at least 30 minutes on 5 or more days each week.  Do not use any products that contain nicotine or tobacco, such as cigarettes, e-cigarettes, and chewing tobacco. If you need help quitting, ask your health care provider.  If you are sexually active, practice safe sex. Use a condom or other form of protection to prevent STIs (sexually transmitted infections).  Talk with your health care provider about taking a low-dose aspirin every day starting at age 60. What's next?  Go to your health care provider once a year for a well check visit.  Ask your health care provider how often you should have your eyes and teeth checked.  Stay up to date on all vaccines. This information is not intended to replace advice given to you by your health care provider. Make sure you discuss any questions you have with your health care provider. Document Released: 09/28/2015 Document Revised: 08/26/2018 Document Reviewed: 08/26/2018 Elsevier  Patient Education  2020 [employer].

## 2019-04-27 NOTE — Progress Notes (Signed)
Patient presents to clinic today to establish care. Patient recently hospitalized for acute diverticulitis with diverticular stricture requiring partial colectomy (sigmoid). Is doing very well after this. No use of pain medicine currently. Notes good. Got staples out last week. Follow-up with surgery on 05/11/2019.    Chronic Issues: Hypertension -- Patient currently on a regimen of losartan 25 mg QD. Endorses taking as directed. Patient denies chest pain, palpitations, lightheadedness, dizziness, vision changes or frequent headaches. Does check BP 2-3 x week at home. Averages 120s/70s-80s. Has recently quit smoking.   BP Readings from Last 3 Encounters:  04/27/19 128/82  04/11/19 (!) 149/86  04/05/19 (!) 152/92   Is currently on a Vitamin D and Multivitamin supplement daily. No history of Vit D deficiency.   Health Maintenance: Immunizations -- Unsure of Tetanus. Will get records.  Colonoscopy -- UTD.    Past Medical History:  Diagnosis Date  . Arthritis    left ankle  . Constipation   . Diverticulitis   . History of gout   . Hypertension   . Sigmoid diverticulosis     Past Surgical History:  Procedure Laterality Date  . COLONOSCOPY    . DENTAL SURGERY    . PARTIAL COLECTOMY N/A 04/07/2019   Procedure: OPEN SIGMOID COLECTOMY;  Surgeon: Darnell Level, MD;  Location: [facility];  Service: General;  Laterality: N/A;  . sigmoid stricture  03/2019    Current Outpatient Medications on File Prior to Visit  Medication Sig Dispense Refill  . Cholecalciferol (VITAMIN D) 50 MCG (2000 UT) CAPS Take 2,000 Units by mouth daily.    Marland Kitchen losartan (COZAAR) 25 MG tablet Take 25 mg by mouth daily.    . Multiple Vitamins-Minerals (ALIVE MENS ENERGY) TABS Take 1 tablet by mouth daily.    Marland Kitchen VITAMIN E PO Take 1,200 mg by mouth daily.     No current facility-administered medications on file prior to visit.     No Known Allergies  Family History  Problem Relation Age of Onset  . Hearing loss  Father   . Hypertension Father   . Hearing loss Brother     Social History   Socioeconomic History  . Marital status: Married    Spouse name: Not on file  . Number of children: Not on file  . Years of education: Not on file  . Highest education level: Not on file  Occupational History  . Not on file  Social Needs  . Financial resource strain: Not on file  . Food insecurity    Worry: Not on file    Inability: Not on file  . Transportation needs    Medical: Not on file    Non-medical: Not on file  Tobacco Use  . Smoking status: Former Smoker    Packs/day: 0.50    Years: 32.00    Pack years: 16.00    Types: Cigarettes    Quit date: 04/04/2019    Years since quitting: 0.0  . Smokeless tobacco: Never Used  Substance and Sexual Activity  . Alcohol use: Not Currently    Comment: rare  . Drug use: Never  . Sexual activity: Not Currently  Lifestyle  . Physical activity    Days per week: Not on file    Minutes per session: Not on file  . Stress: Not on file  Relationships  . Social Musician on phone: Not on file    Gets together: Not on file    Attends religious service: Not  on file    Active member of club or organization: Not on file    Attends meetings of clubs or organizations: Not on file    Relationship status: Not on file  . Intimate partner violence    Fear of current or ex partner: Not on file    Emotionally abused: Not on file    Physically abused: Not on file    Forced sexual activity: Not on file  Other Topics Concern  . Not on file  Social History Narrative  . Not on file   Review of Systems  Constitutional: Negative for fever and weight loss.  HENT: Negative for ear discharge, ear pain, hearing loss and tinnitus.   Eyes: Negative for blurred vision, double vision, photophobia and pain.  Respiratory: Negative for cough and shortness of breath.   Cardiovascular: Negative for chest pain and palpitations.  Gastrointestinal: Negative for  abdominal pain, blood in stool, constipation, diarrhea, heartburn, melena, nausea and vomiting.  Genitourinary: Negative for dysuria, flank pain, frequency, hematuria and urgency.  Musculoskeletal: Negative for falls.  Neurological: Negative for dizziness, loss of consciousness and headaches.  Endo/Heme/Allergies: Negative for environmental allergies.  Psychiatric/Behavioral: Negative for depression, hallucinations, substance abuse and suicidal ideas. The patient is not nervous/anxious and does not have insomnia.    BP 128/82   Pulse 63   Temp 98.8 F (37.1 C) (Skin)   Resp 16   Ht 5\' 11"  (1.803 m)   Wt 213 lb (96.6 kg)   SpO2 98%   BMI 29.71 kg/m   Physical Exam Vitals signs reviewed.  Constitutional:      General: He is not in acute distress.    Appearance: He is well-developed. He is not diaphoretic.  HENT:     Head: Normocephalic and atraumatic.     Right Ear: Tympanic membrane, ear canal and external ear normal.     Left Ear: Tympanic membrane, ear canal and external ear normal.     Nose: Nose normal.     Mouth/Throat:     Pharynx: No posterior oropharyngeal erythema.  Eyes:     Conjunctiva/sclera: Conjunctivae normal.     Pupils: Pupils are equal, round, and reactive to light.  Neck:     Musculoskeletal: Neck supple.     Thyroid: No thyromegaly.  Cardiovascular:     Rate and Rhythm: Normal rate and regular rhythm.     Heart sounds: Normal heart sounds.  Pulmonary:     Effort: Pulmonary effort is normal. No respiratory distress.     Breath sounds: Normal breath sounds. No wheezing or rales.  Chest:     Chest wall: No tenderness.  Abdominal:     General: Bowel sounds are normal. There is no distension.     Palpations: Abdomen is soft. There is no mass.     Tenderness: There is no abdominal tenderness. There is no guarding or rebound.     Hernia: There is no hernia in the left inguinal area or right inguinal area.    Genitourinary:    Penis: Normal.       Scrotum/Testes: Normal.     Epididymis:     Right: Normal.     Left: Normal.  Lymphadenopathy:     Cervical: No cervical adenopathy.  Skin:    General: Skin is warm and dry.     Findings: No rash.  Neurological:     Mental Status: He is alert and oriented to person, place, and time.     Cranial Nerves: No  cranial nerve deficit.     Recent Results (from the past 2160 hour(s))  SARS Coronavirus 2 (Performed in [facility] hospital lab)     Status: None   Collection Time: 04/04/19  8:58 AM   Specimen: Nasal Swab  Result Value Ref Range   SARS Coronavirus 2 NEGATIVE NEGATIVE    Comment: (NOTE) SARS-CoV-2 target nucleic acids are NOT DETECTED. The SARS-CoV-2 RNA is generally detectable in upper and lower respiratory specimens during the acute phase of infection. Negative results do not preclude SARS-CoV-2 infection, do not rule out co-infections with other pathogens, and should not be used as the sole basis for treatment or other patient management decisions. Negative results must be combined with clinical observations, patient history, and epidemiological information. The expected result is Negative. Fact Sheet for Patients: HairSlick.nohttps://www.fda.gov/media/138098/download Fact Sheet for Healthcare Providers: quierodirigir.comhttps://www.fda.gov/media/138095/download This test is not yet approved or cleared by the [place] FDA and  has been authorized for detection and/or diagnosis of SARS-CoV-2 by FDA under an Emergency Use Authorization (EUA). This EUA will remain  in effect (meaning this test can be used) for the duration of the COVID-19 declaration under Section 56 4(b)(1) of the Act, 21 U.S.C. section 360bbb-3(b)(1), unless the authorization is terminated or revoked sooner. Performed at [facility], [address] [place], [place] [address]   Hemoglobin A1c     Status: Abnormal   Collection Time: 04/05/19  8:40 AM  Result Value Ref Range   Hgb A1c MFr Bld 6.0 (H) 4.8 - 5.6 %     Comment: (NOTE) Pre diabetes:          5.7%-6.4% Diabetes:              >6.4% Glycemic control for   <7.0% adults with diabetes    Mean Plasma Glucose 125.5 mg/dL    Comment: Performed at [facility], [address] [place], [place] [address]  Basic metabolic panel     Status: Abnormal   Collection Time: 04/05/19  8:40 AM  Result Value Ref Range   Sodium 137 135 - 145 mmol/L   Potassium 4.8 3.5 - 5.1 mmol/L   Chloride 104 98 - 111 mmol/L   CO2 26 22 - 32 mmol/L   Glucose, Bld 92 70 - 99 mg/dL   BUN 26 (H) 6 - 20 mg/dL   Creatinine, Ser 0.981.02 0.61 - 1.24 mg/dL   Calcium 9.1 8.9 - 11.910.3 mg/dL   GFR calc non Af Amer >60 >60 mL/min   GFR calc Af Amer >60 >60 mL/min   Anion gap 7 5 - 15    Comment: Performed at [facility], [address] [place], [place] [address]  CBC     Status: Abnormal   Collection Time: 04/05/19  8:40 AM  Result Value Ref Range   WBC 16.1 (H) 4.0 - 10.5 K/uL   RBC 5.08 4.22 - 5.81 MIL/uL   Hemoglobin 15.4 13.0 - 17.0 g/dL   HCT 95.648.2 21.339.0 - 08.652.0 %   MCV 94.9 80.0 - 100.0 fL   MCH 30.3 26.0 - 34.0 pg   MCHC 32.0 30.0 - 36.0 g/dL   RDW 57.812.2 46.911.5 - 62.915.5 %   Platelets 285 150 - 400 K/uL   nRBC 0.0 0.0 - 0.2 %    Comment: Performed at [facility], [address] [place], [place] [address]  CBC     Status: Abnormal   Collection Time: 04/07/19  5:18 PM  Result Value Ref Range   WBC 27.4 (H) 4.0 - 10.5 K/uL   RBC 4.94 4.22 - 5.81 MIL/uL   Hemoglobin 15.2 13.0 - 17.0 g/dL   HCT 47.5 39.0 - 52.0 %   MCV 96.2 80.0 - 100.0 fL   MCH 30.8 26.0 - 34.0 pg   MCHC 32.0 30.0 - 36.0 g/dL   RDW 12.3 11.5 - 15.5 %   Platelets 268 150 - 400 K/uL   nRBC 0.0 0.0 - 0.2 %    Comment: Performed at [facility], [place] [address], [place], [place] [address]  Creatinine, serum     Status: None   Collection Time: 04/07/19  5:18 PM  Result Value Ref Range   Creatinine, Ser 1.10 0.61 - 1.24 mg/dL   GFR calc non Af  Amer >60 >60 mL/min   GFR calc Af Amer >60 >60 mL/min    Comment: Performed at [facility], [place] [address], [place], [place] [address]  Basic metabolic panel     Status: Abnormal   Collection Time: 04/08/19  3:37 AM  Result Value Ref Range   Sodium 133 (L) 135 - 145 mmol/L   Potassium 4.5 3.5 - 5.1 mmol/L   Chloride 102 98 - 111 mmol/L   CO2 25 22 - 32 mmol/L   Glucose, Bld 153 (H) 70 - 99 mg/dL   BUN 17 6 - 20 mg/dL   Creatinine, Ser 0.94 0.61 - 1.24 mg/dL   Calcium 7.7 (L) 8.9 - 10.3 mg/dL   GFR calc non Af Amer >60 >60 mL/min   GFR calc Af Amer >60 >60 mL/min   Anion gap 6 5 - 15    Comment: Performed at [facility], [place] [address], [place], [place] [address]  CBC     Status: Abnormal   Collection Time: 04/08/19  3:37 AM  Result Value Ref Range   WBC 22.4 (H) 4.0 - 10.5 K/uL   RBC 4.43 4.22 - 5.81 MIL/uL   Hemoglobin 13.7 13.0 - 17.0 g/dL   HCT 42.2 39.0 - 52.0 %   MCV 95.3 80.0 - 100.0 fL   MCH 30.9 26.0 - 34.0 pg   MCHC 32.5 30.0 - 36.0 g/dL   RDW 12.3 11.5 - 15.5 %   Platelets 236 150 - 400 K/uL   nRBC 0.0 0.0 - 0.2 %    Comment: Performed at [facility], [place] [address] [place], [place] [address]  CBC     Status: Abnormal   Collection Time: 04/10/19  8:43 AM  Result Value Ref Range   WBC 14.6 (H) 4.0 - 10.5 K/uL   RBC 3.67 (L) 4.22 - 5.81 MIL/uL   Hemoglobin 11.2 (L) 13.0 - 17.0 g/dL   HCT 36.0 (L) 39.0 - 52.0 %   MCV 98.1 80.0 - 100.0 fL   MCH 30.5 26.0 - 34.0 pg   MCHC 31.1 30.0 - 36.0 g/dL   RDW 12.4 11.5 - 15.5 %   Platelets 203 150 - 400 K/uL   nRBC 0.0 0.0 - 0.2 %    Comment: Performed at [facility], [place] [address], [place],  [address]    Assessment/Plan: 1. Visit for preventive health examination Depression screen negative. Health Maintenance reviewed. Preventive schedule discussed and handout given in AVS. Will obtain fasting labs today.  - CBC with  Differential/Platelet - Comprehensive metabolic panel - Lipid panel - TSH  2. Essential hypertension BP normotensive. Asymptomatic. Repeat labs  today. Continue current regimen. - Comprehensive metabolic panel - Lipid panel  3. Prostate cancer screening The natural history of prostate cancer and ongoing controversy regarding screening and potential treatment outcomes of prostate cancer has been discussed with the patient. The meaning of a false positive PSA and a false negative PSA has been discussed. He indicates understanding of the limitations of this screening test and wishes to proceed with screening PSA testing.  - PSA   David ClimesWilliam Cody Kaliope Quinonez, PA-C

## 2019-05-03 ENCOUNTER — Encounter: Payer: Self-pay | Admitting: Emergency Medicine

## 2019-06-02 ENCOUNTER — Other Ambulatory Visit: Payer: Self-pay

## 2019-06-02 ENCOUNTER — Telehealth: Payer: Self-pay | Admitting: Physician Assistant

## 2019-06-02 MED ORDER — LOSARTAN POTASSIUM 25 MG PO TABS: 25.0000 mg | ORAL_TABLET | Freq: Every day | ORAL | 0 refills | Status: DC

## 2019-06-02 NOTE — Telephone Encounter (Signed)
Script has been refilled.  

## 2019-06-02 NOTE — Telephone Encounter (Signed)
Pt needs a refill on the losartan pt has been out for 4 days. Last appt was 04/27/2019

## 2019-07-29 ENCOUNTER — Other Ambulatory Visit: Payer: Self-pay | Admitting: Emergency Medicine

## 2019-07-29 MED ORDER — LOSARTAN POTASSIUM 25 MG PO TABS: 25.0000 mg | ORAL_TABLET | Freq: Every day | ORAL | 1 refills | Status: DC

## 2020-01-07 ENCOUNTER — Other Ambulatory Visit: Payer: Self-pay | Admitting: Physician Assistant

## 2020-07-09 ENCOUNTER — Other Ambulatory Visit: Payer: Self-pay | Admitting: Physician Assistant

## 2020-10-29 IMAGING — RF BE LIMITED WITH CONTRAST
3 series · 14 of 19 positions shown · non-contrast
Comparison: None.

CLINICAL DATA: Diverticular disease with sigmoid stricture
identified on recent colonoscopy. Preoperative evaluation prior to
partial colectomy.

EXAM:
SINGLE COLUMN BARIUM ENEMA
TECHNIQUE: Initial scout AP supine abdominal image obtained to insure adequate
colon cleansing. Barium was introduced into the colon in a
retrograde fashion and refluxed from the rectum to the cecum. Spot
images of the colon followed by overhead radiographs were obtained.
FLUOROSCOPY TIME:  Fluoroscopy Time:  3 minutes 6 seconds
Radiation Exposure Index (if provided by the fluoroscopic device):
1,102 mGy
Number of Acquired Spot Images: 14

[Series 1: one shot · 0.14mm/px · 10 of 18 slices shown (1 of 2)]
[im 1/18]
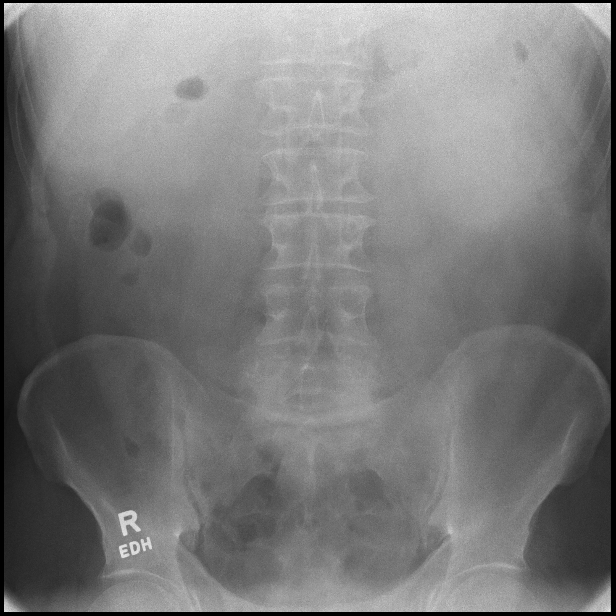
[im 3/18]
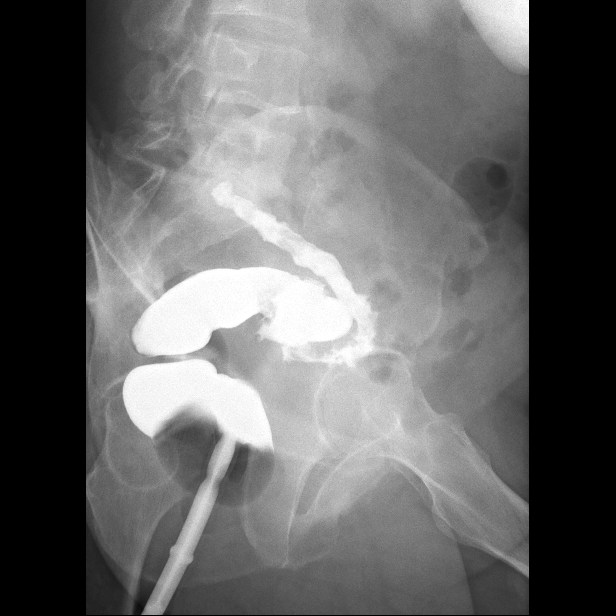
[im 5/18]
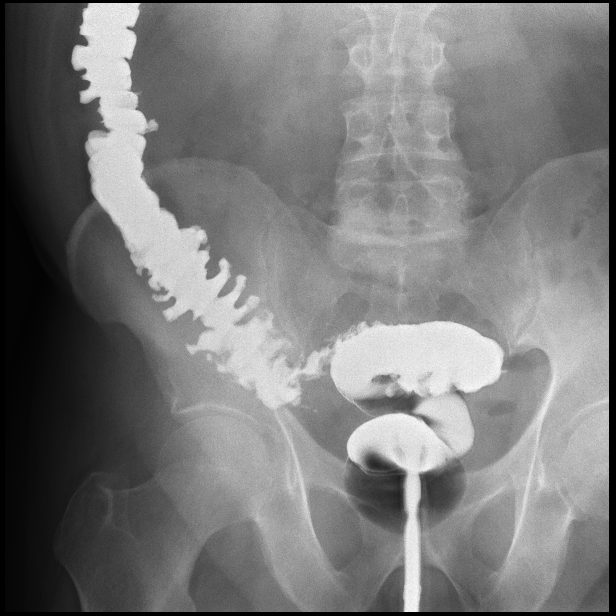
[im 6/18]
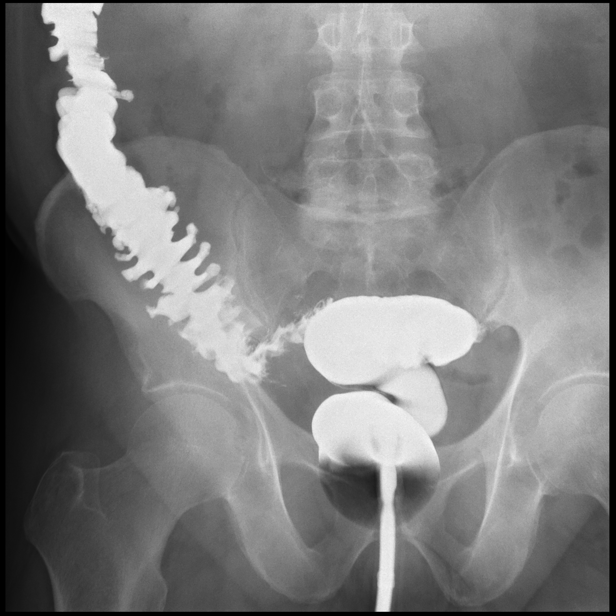
[im 9/18]
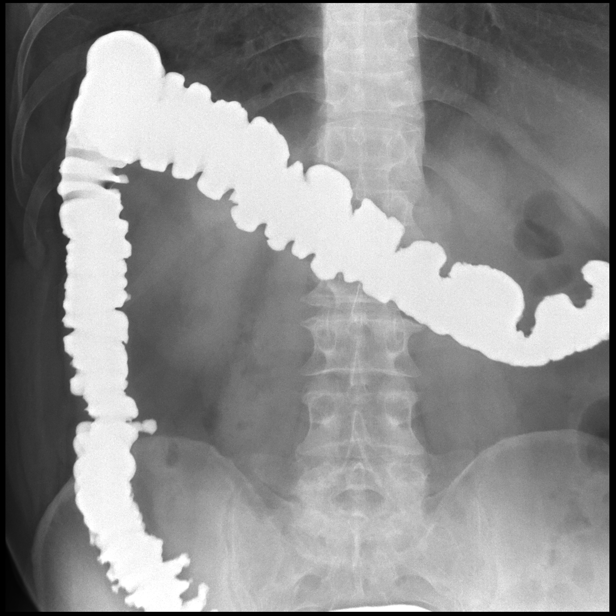
[im 10/18]
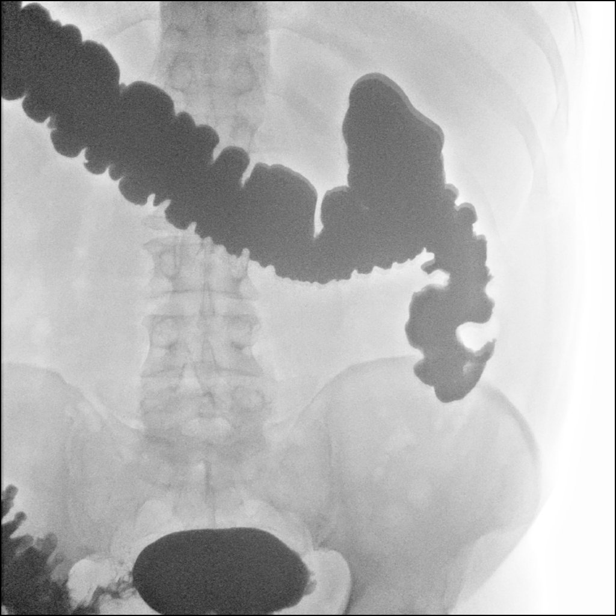
[im 12/18]
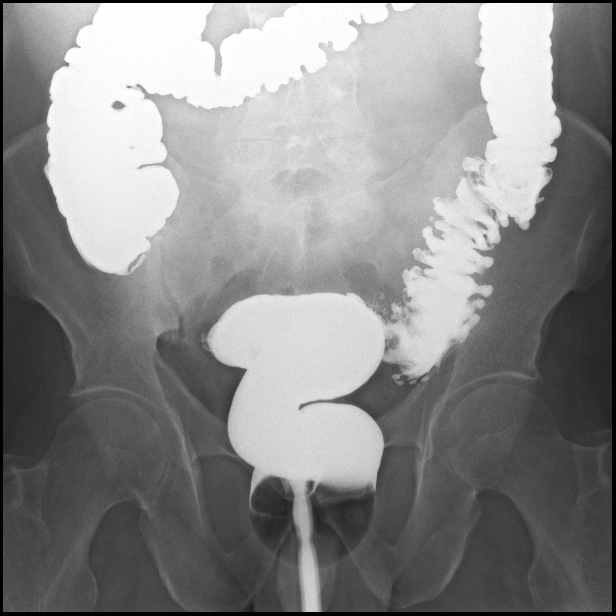
[im 15/18]
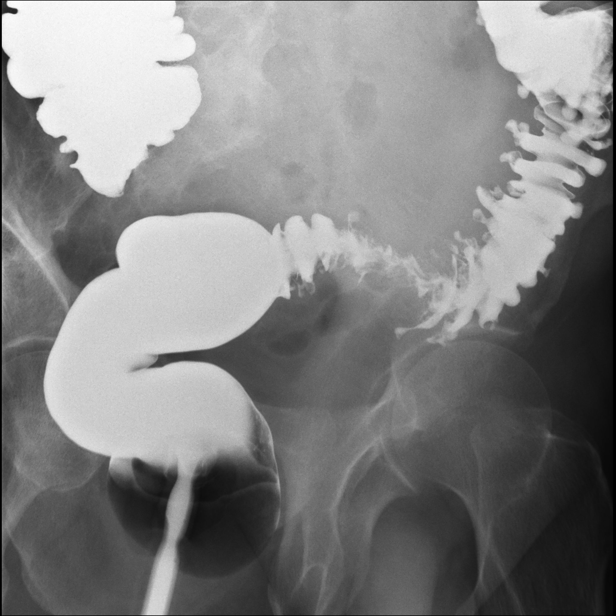
[im 16/18]
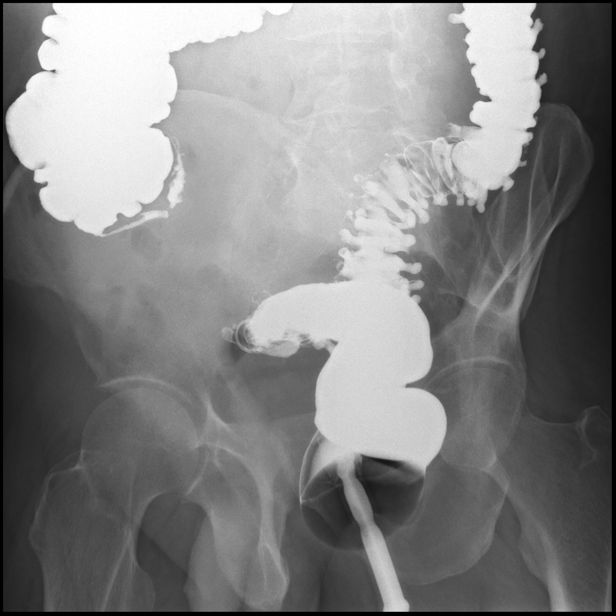
[im 18/18]
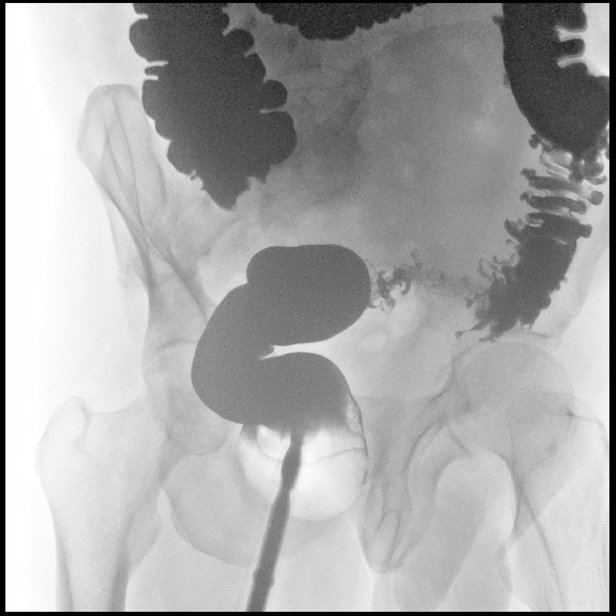

[Series 2: sequence · 3 of 49 frames shown]
[frame 25/49]
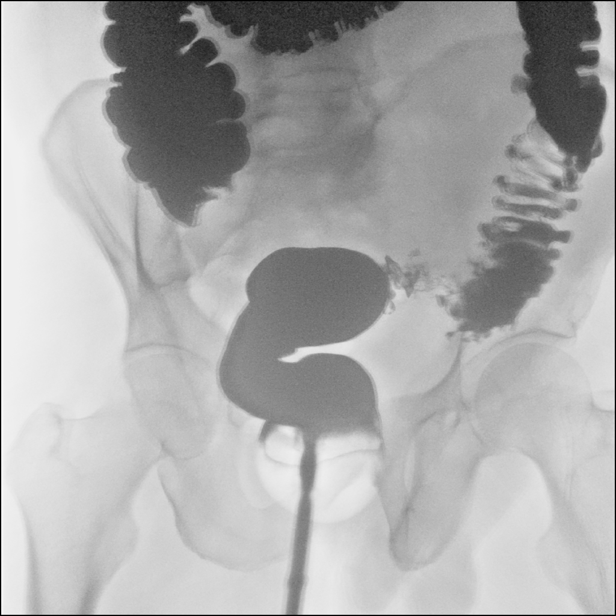
[frame 38/49]
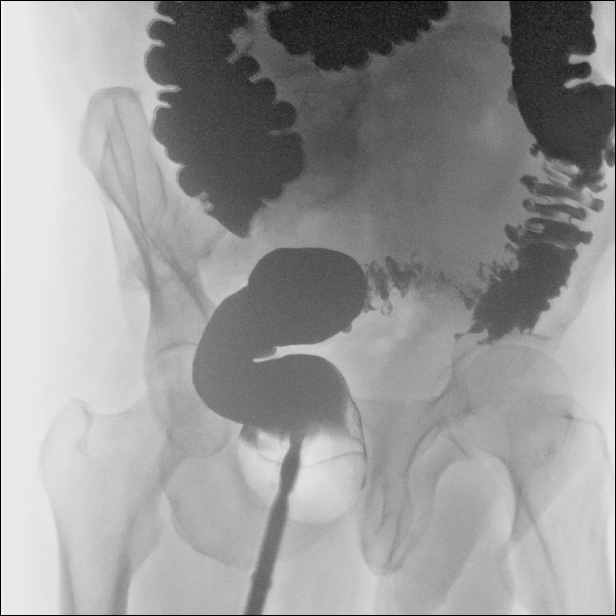
[frame 42/49]
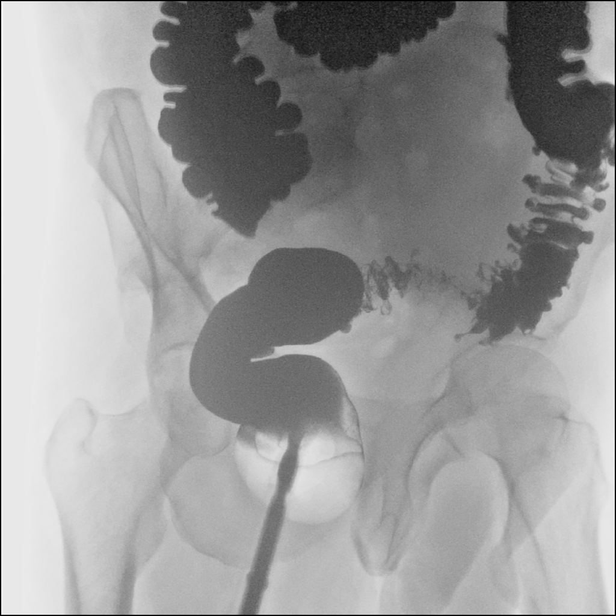

[Series 3: one shot · 0.14mm/px · 1 of 3 slices shown (2 of 2)]
[im 3/3]
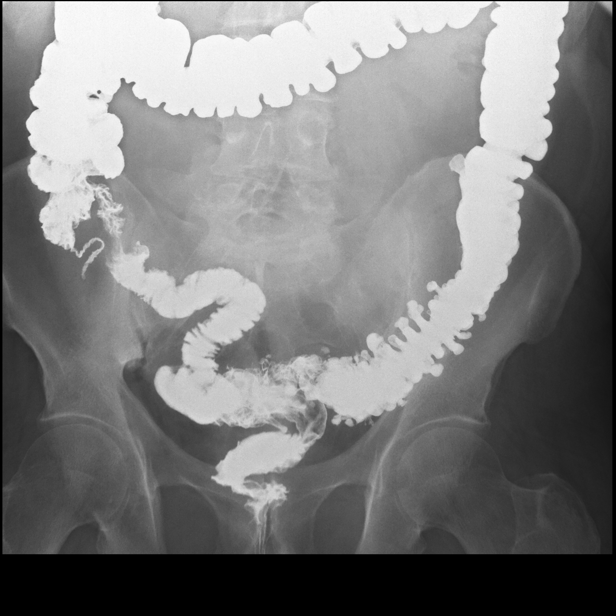

[14 of 19 positions shown; findings below may reference images not displayed]

FINDINGS: Scout radiograph demonstrates no dilated small bowel loops. No
radiopaque nephrolithiasis. No evidence of pneumatosis or
pneumoperitoneum. Mild lumbar spondylosis.

Moderate sigmoid diverticulosis is identified. Contrast is refluxed
via rectal catheter to the cecum without significant delay, with
filling of the appendix and terminal ileum. There is a moderate
length segmental stricture of the mid to distal sigmoid colon at the
site of diverticular disease with irregular luminal contour, without
discrete filling defect. No colonic fistula. Normal distention of
the rectum and rectosigmoid junction. No additional colonic
strictures. No significant filling defects in the colon.
IMPRESSION: 1. Moderate sigmoid diverticulosis.
2. Moderate length irregular segmental structure of the mid to
distal sigmoid colon at the site of diverticular disease, with
nonspecific imaging features for neoplastic versus chronic
diverticular stricture. Recommend correlation with reported recent
colonoscopy. Rectum is normal.
3. Otherwise normal colon.

## 2023-11-06 ENCOUNTER — Other Ambulatory Visit (HOSPITAL_BASED_OUTPATIENT_CLINIC_OR_DEPARTMENT_OTHER): Payer: Self-pay

## 2023-11-06 MED ORDER — SHINGRIX 50 MCG/0.5ML IM SUSR
0.5000 mL | Freq: Once | INTRAMUSCULAR | 0 refills | Status: AC
  Filled 2023-11-06: qty 0.5, 1d supply, fill #0
